# Patient Record
Sex: Male | Born: 2000 | Race: Black or African American | Hispanic: No | Marital: Single | Smoking: Current every day smoker
Health system: Southern US, Community
[De-identification: ages and names within clinical notes are randomized; demographics above are authoritative.]

---
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
## 2018-03-30 IMAGING — DX DG WRIST COMPLETE 3+V*R*
4 series · 4 of 4 positions shown · non-contrast
Comparison: None.

CLINICAL DATA: Fall 1 week ago

EXAM:
RIGHT WRIST - COMPLETE 3+ VIEW

[wrist pa]
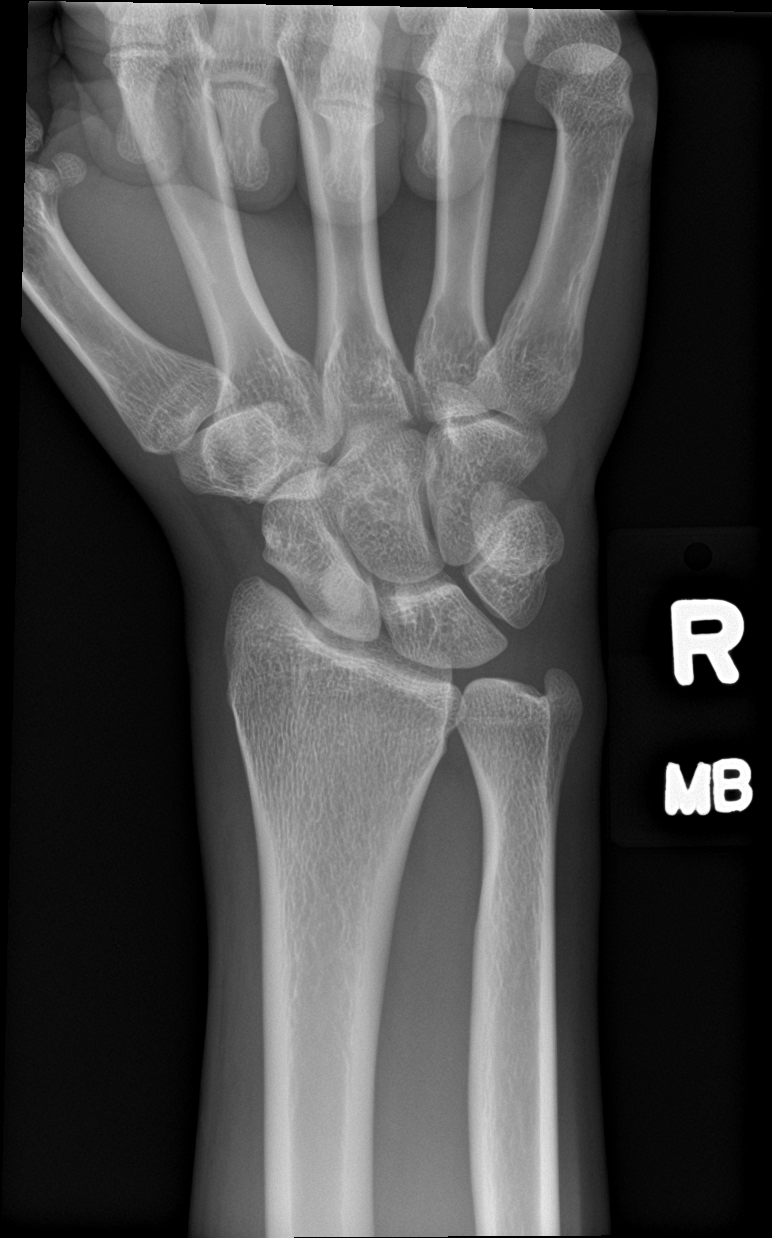

[wrist navicular]
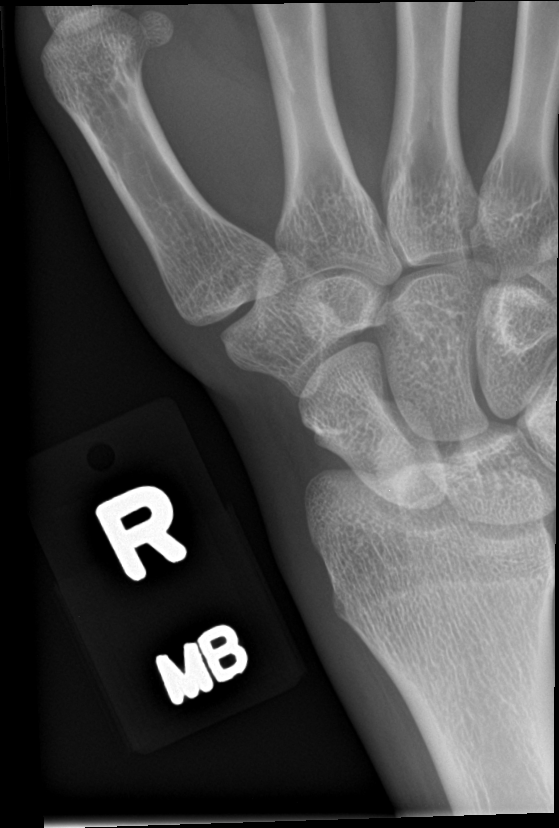

[wrist obl]
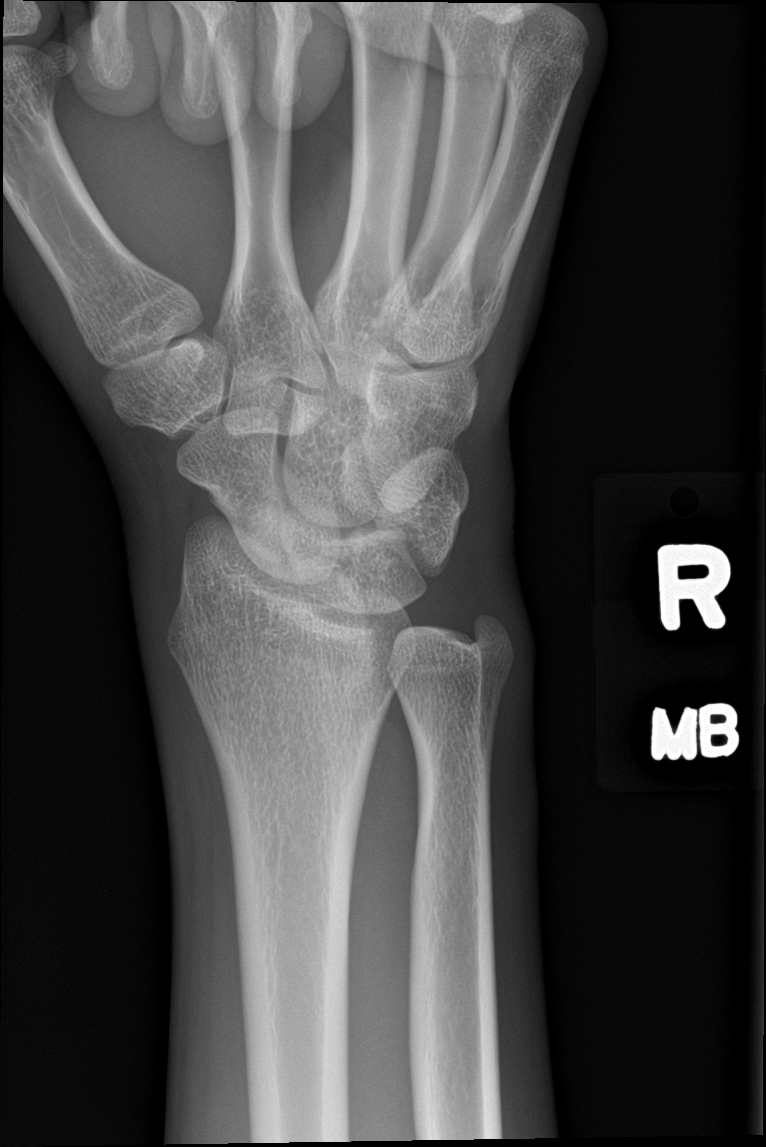

[wrist lat]
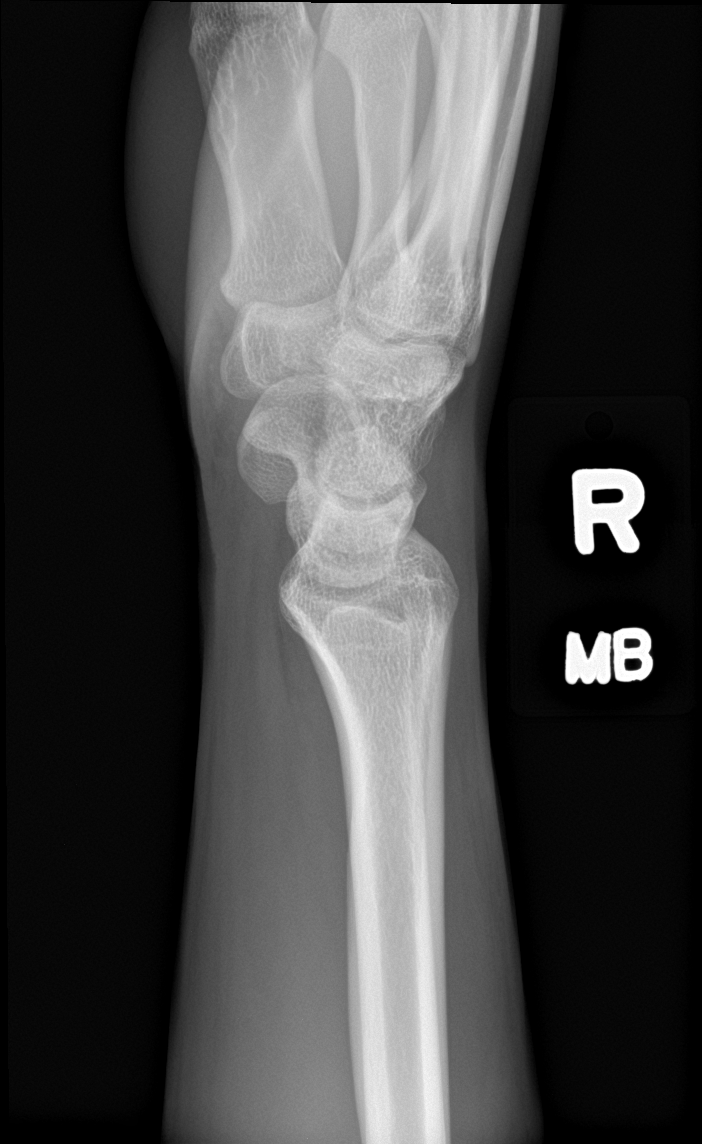

[4 of 4 positions shown; findings below may reference images not displayed]

FINDINGS: There is no evidence of fracture or dislocation. There is no
evidence of arthropathy or other focal bone abnormality. Soft
tissues are unremarkable.
IMPRESSION: Negative.

## 2019-07-07 DIAGNOSIS — F410 Panic disorder [episodic paroxysmal anxiety] without agoraphobia: Secondary | ICD-10-CM | POA: Insufficient documentation

## 2019-07-07 DIAGNOSIS — M549 Dorsalgia, unspecified: Secondary | ICD-10-CM | POA: Insufficient documentation

## 2019-07-07 DIAGNOSIS — R232 Flushing: Secondary | ICD-10-CM | POA: Insufficient documentation

## 2019-08-28 ENCOUNTER — Other Ambulatory Visit: Payer: Self-pay

## 2019-08-28 ENCOUNTER — Encounter (HOSPITAL_COMMUNITY): Payer: Self-pay | Admitting: Emergency Medicine

## 2019-08-28 ENCOUNTER — Ambulatory Visit (HOSPITAL_COMMUNITY)
Admission: EM | Admit: 2019-08-28 | Discharge: 2019-08-28 | Disposition: A | Discharge status: Home / Self Care | Payer: Medicaid Other | Attending: Emergency Medicine | Admitting: Emergency Medicine

## 2019-08-28 DIAGNOSIS — R300 Dysuria: Secondary | ICD-10-CM

## 2019-08-28 DIAGNOSIS — Z113 Encounter for screening for infections with a predominantly sexual mode of transmission: Secondary | ICD-10-CM | POA: Diagnosis present

## 2019-08-28 LAB — POCT URINALYSIS DIP (DEVICE)
Bilirubin Urine: NEGATIVE
Glucose, UA: NEGATIVE mg/dL
Ketones, ur: NEGATIVE mg/dL
Leukocytes,Ua: NEGATIVE
Nitrite: NEGATIVE
Protein, ur: NEGATIVE mg/dL
Specific Gravity, Urine: 1.015 (ref 1.005–1.030)
Urobilinogen, UA: 0.2 mg/dL (ref 0.0–1.0)
pH: 7 (ref 5.0–8.0)

## 2019-08-28 MED ORDER — PHENAZOPYRIDINE HCL 200 MG PO TABS: 200.0000 mg | ORAL_TABLET | Freq: Three times a day (TID) | ORAL | 0 refills | Status: DC

## 2019-08-28 NOTE — Discharge Instructions (Addendum)
Push fluids use Pyridium OTC prn.  Call or return to clinic if these symptoms worsen or fail to improve as anticipated.

## 2019-08-28 NOTE — ED Triage Notes (Signed)
Pt states he thinks he has aUTI x 4 days. Pt states he has been voiding a lot . Pt states he has stomach pain as well.

## 2019-08-28 NOTE — ED Provider Notes (Signed)
[facility]    CSN: [id number] Arrival date & time: 08/28/19  1513      History   Chief Complaint Chief Complaint  Patient presents with  . Urinary Tract Infection    HPI Shawn Ferrell is a 18 y.o. male.   18 years old male presented to the urgent care with a complaint of lower lower abdominal pain, urgency and burning and tingling sensation while urinating x3 to 4 days.  He is sexually active with 1 male partner.  Denies chills and fever, any STD exposure, nausea, vomiting,  diarrhea and penile discharge.  The history is provided by the patient. A language interpreter was used.    History reviewed. No pertinent past medical history.  Patient Active Problem List   Diagnosis Date Noted  . Generalized headaches 12/26/2016    History reviewed. No pertinent surgical history.     Home Medications    Prior to Admission medications   Medication Sig Start Date End Date Taking? Authorizing Provider  cetirizine (ZYRTEC) 10 MG tablet Take 10 mg by mouth daily at 6 PM.    [provider]  phenazopyridine (PYRIDIUM) 200 MG tablet Take 1 tablet (200 mg total) by mouth 3 (three) times daily. 08/28/19   Darrah Dredge, Zachery Dakins, FNP  traZODone (DESYREL) 50 MG tablet Take 50 mg by mouth.    [provider]    Family History No family history on file.  Social History Social History   Tobacco Use  . Smoking status: Never Smoker  . Smokeless tobacco: Never Used  Substance Use Topics  . Alcohol use: No  . Drug use: No     Allergies   Patient has no known allergies.   Review of Systems Review of Systems  Constitutional: Negative.   HENT: Negative.   Respiratory: Negative.   Cardiovascular: Negative.   Gastrointestinal: Positive for abdominal pain.  Genitourinary: Positive for dysuria and urgency.  ROS: All other are negatives   Physical Exam Triage Vital Signs ED Triage Vitals  Enc Vitals Group     BP      Pulse      Resp    Temp      Temp src      SpO2      Weight      Height      Head Circumference      Peak Flow      Pain Score      Pain Loc      Pain Edu?      Excl. in GC?    No data found.  Updated Vital Signs BP 127/68 (BP Location: Right Arm)   Pulse 100   Temp 98 F (36.7 C) (Oral)   Resp 18   SpO2 100%   Visual Acuity Right Eye Distance:   Left Eye Distance:   Bilateral Distance:    Right Eye Near:   Left Eye Near:    Bilateral Near:     Physical Exam Constitutional:      General: He is not in acute distress.    Appearance: Normal appearance. He is normal weight. He is not ill-appearing or toxic-appearing.  Cardiovascular:     Rate and Rhythm: Normal rate and regular rhythm.     Pulses: Normal pulses.     Heart sounds: Normal heart sounds.  Pulmonary:     Effort: Pulmonary effort is normal. No respiratory distress.     Breath sounds: Normal breath sounds.  Chest:  Chest wall: No tenderness.  Abdominal:     General: Bowel sounds are normal. There is no distension.     Palpations: Abdomen is soft. There is no mass.     Tenderness: There is no abdominal tenderness.     Hernia: No hernia is present.  Neurological:     Mental Status: He is alert.      UC Treatments / Results  Labs (all labs ordered are listed, but only abnormal results are displayed) Labs Reviewed  POCT URINALYSIS DIP (DEVICE) - Abnormal; Notable for the following components:      Result Value   Hgb urine dipstick TRACE (*)    All other components within normal limits  URINE CULTURE  CYTOLOGY, (ORAL, ANAL, URETHRAL) ANCILLARY ONLY    EKG   Radiology No results found.  Procedures Procedures (including critical care time)  Medications Ordered in UC Medications - No data to display  Initial Impression / Assessment and Plan / UC Course  I have reviewed the triage vital signs and the nursing notes.  Pertinent labs & imaging results that were available during my care of the patient were  reviewed by me and considered in my medical decision making (see chart for details).     Patient stable for discharge.  Benign physical exam. UA show trace of blood . Urine sent for culture. Gonorrhea screening  and chlamydia was completed. Final Clinical Impressions(s) / UC Diagnoses   Final diagnoses:  Screen for STD (sexually transmitted disease)  Dysuria     Discharge Instructions     Push fluids use Pyridium OTC prn.  Call or return to clinic if these symptoms worsen or fail to improve as anticipated.     ED Prescriptions    Medication Sig Dispense Auth. Provider   phenazopyridine (PYRIDIUM) 200 MG tablet Take 1 tablet (200 mg total) by mouth 3 (three) times daily. 6 tablet Jesse Nosbisch, Darrelyn Hillock, FNP     PDMP not reviewed this encounter.   Emerson Monte, Jessamine 08/28/19 1656

## 2019-08-29 LAB — URINE CULTURE: Culture: NO GROWTH

## 2019-08-30 ENCOUNTER — Telehealth (HOSPITAL_COMMUNITY): Payer: Self-pay | Admitting: Emergency Medicine

## 2019-08-30 LAB — CYTOLOGY, (ORAL, ANAL, URETHRAL) ANCILLARY ONLY
Chlamydia: POSITIVE — AB
Neisseria Gonorrhea: NEGATIVE
Trichomonas: POSITIVE — AB

## 2019-08-30 MED ORDER — METRONIDAZOLE 500 MG PO TABS: 2000.0000 mg | ORAL_TABLET | Freq: Once | ORAL | 0 refills | Status: AC

## 2019-08-30 MED ORDER — AZITHROMYCIN 250 MG PO TABS: 1000.0000 mg | ORAL_TABLET | Freq: Once | ORAL | 0 refills | Status: AC

## 2019-08-30 NOTE — Telephone Encounter (Signed)
Chlamydia is positive.  Rx po zithromax 1g #1 dose no refills was sent to the pharmacy of record.  Pt needs education to please refrain from sexual intercourse for 7 days to give the medicine time to work, sexual partners need to be notified and tested/treated.  Condoms may reduce risk of reinfection.  Recheck or followup with PCP for further evaluation if symptoms are not improving.   [facility] notified.  Trichomonas is positive. Rx  for Flagyl 2 grams, once was sent to the pharmacy of record. Pt needs education to refrain from sexual intercourse for 7 days to give the medicine time to work. Sexual partners need to be notified and tested/treated. Condoms may reduce risk of reinfection. Recheck for further evaluation if symptoms are not improving.   Patient contacted by phone and made aware of    results. Pt verbalized understanding and had all questions answered.

## 2019-10-18 ENCOUNTER — Other Ambulatory Visit: Payer: Self-pay

## 2019-10-18 ENCOUNTER — Encounter (HOSPITAL_COMMUNITY): Payer: Self-pay

## 2019-10-18 ENCOUNTER — Ambulatory Visit (HOSPITAL_COMMUNITY)
Admission: EM | Admit: 2019-10-18 | Discharge: 2019-10-18 | Disposition: A | Discharge status: Home / Self Care | Payer: Medicaid Other | Attending: Internal Medicine | Admitting: Internal Medicine

## 2019-10-18 DIAGNOSIS — R350 Frequency of micturition: Secondary | ICD-10-CM | POA: Diagnosis not present

## 2019-10-18 DIAGNOSIS — R1030 Lower abdominal pain, unspecified: Secondary | ICD-10-CM

## 2019-10-18 LAB — POCT URINALYSIS DIP (DEVICE)
Bilirubin Urine: NEGATIVE
Glucose, UA: NEGATIVE mg/dL
Ketones, ur: NEGATIVE mg/dL
Nitrite: NEGATIVE
Protein, ur: NEGATIVE mg/dL
Specific Gravity, Urine: >=1.03 (ref 1.005–1.030)
Urobilinogen, UA: 0.2 mg/dL (ref 0.0–1.0)
pH: 6.5 (ref 5.0–8.0)

## 2019-10-18 LAB — HIV ANTIBODY (ROUTINE TESTING W REFLEX): HIV Screen 4th Generation wRfx: NONREACTIVE

## 2019-10-18 NOTE — ED Triage Notes (Signed)
Pt c/o lower abd pain x4 days. Denies n/v; last BM this afternoon and states it was soft. States increase in urination with some burning; urgency, frequency x5- 6 days.   Denies fever, chills, hematuria, or penile discharge. Denies URI sx.

## 2019-10-18 NOTE — Discharge Instructions (Addendum)
We have sent your lab work, we will notify you of necessary treatments.  If none of your tests reveal a positive result, please follow up with your primary care to discuss further evaluation  If painful urination or discharge develop prior to results, please notify our clinic and we will arrange treatment.

## 2019-10-18 NOTE — ED Provider Notes (Signed)
[facility]    CSN: [id number] Arrival date & time: 10/18/19  1513      History   Chief Complaint Chief Complaint  Patient presents with  . Abdominal Pain  . Dysuria    HPI Shawn Ferrell is a 19 y.o. male.   Patient reports to urgent care today for 5-6 days of lower abdominal discomfort and increased urinary frequency. He denies painful urination but states a "tingling" after he urinates. The abdominal pain comes and goes. He does not associate it with urination. He denies penile discharge or testicular pain. He denies fever and chills.   He reports completing his prescribed treatments for chlamydia and trichomoniasis in December. He denies sexual contact with that partner during treatment time. Has not been sexually active since Jan 1.      History reviewed. No pertinent past medical history.  Patient Active Problem List   Diagnosis Date Noted  . Generalized headaches 12/26/2016    History reviewed. No pertinent surgical history.     Home Medications    Prior to Admission medications   Medication Sig Start Date End Date Taking? Authorizing Provider  cetirizine (ZYRTEC) 10 MG tablet Take 10 mg by mouth daily at 6 PM.    [provider]  phenazopyridine (PYRIDIUM) 200 MG tablet Take 1 tablet (200 mg total) by mouth 3 (three) times daily. 08/28/19   Avegno, Zachery Dakins, FNP  traZODone (DESYREL) 50 MG tablet Take 50 mg by mouth.    [provider]    Family History Family History  Problem Relation Age of Onset  . Hypertension Mother   . Stroke Father   . Hypertension Father     Social History Social History   Tobacco Use  . Smoking status: Never Smoker  . Smokeless tobacco: Never Used  Substance Use Topics  . Alcohol use: Yes  . Drug use: Yes    Types: Marijuana     Allergies   Patient has no known allergies.   Review of Systems Review of Systems  Constitutional: Negative for chills and fever.  HENT: Negative.     Eyes: Negative for pain and visual disturbance.  Respiratory: Negative.   Cardiovascular: Negative for chest pain and palpitations.  Gastrointestinal: Positive for abdominal pain. Negative for diarrhea, nausea, rectal pain and vomiting.  Genitourinary: Positive for frequency and urgency. Negative for discharge, dysuria, flank pain, hematuria, penile pain and penile swelling.  Musculoskeletal: Negative for arthralgias, back pain and myalgias.  Skin: Negative for color change and rash.  Neurological: Negative for seizures, syncope and headaches.  All other systems reviewed and are negative.    Physical Exam Triage Vital Signs ED Triage Vitals  Enc Vitals Group     BP 10/18/19 1541 120/60     Pulse Rate 10/18/19 1541 79     Resp 10/18/19 1541 16     Temp 10/18/19 1541 98.5 F (36.9 C)     Temp Source 10/18/19 1541 Oral     SpO2 10/18/19 1541 99 %     Weight --      Height --      Head Circumference --      Peak Flow --      Pain Score 10/18/19 1537 5     Pain Loc --      Pain Edu? --      Excl. in GC? --    No data found.  Updated Vital Signs BP 120/60 (BP Location: Right Arm)   Pulse 79  Temp 98.5 F (36.9 C) (Oral)   Resp 16   SpO2 99%   Visual Acuity Right Eye Distance:   Left Eye Distance:   Bilateral Distance:    Right Eye Near:   Left Eye Near:    Bilateral Near:     Physical Exam Vitals and nursing note reviewed.  Constitutional:      General: He is not in acute distress.    Appearance: He is well-developed. He is not ill-appearing.  HENT:     Head: Normocephalic and atraumatic.  Eyes:     Conjunctiva/sclera: Conjunctivae normal.  Cardiovascular:     Rate and Rhythm: Normal rate and regular rhythm.     Heart sounds: No murmur.  Pulmonary:     Effort: Pulmonary effort is normal. No respiratory distress.     Breath sounds: Normal breath sounds.  Abdominal:     General: Abdomen is flat.     Palpations: Abdomen is soft.     Tenderness: There is  no abdominal tenderness. There is no right CVA tenderness or left CVA tenderness.     Hernia: No hernia is present.  Genitourinary:    Penis: Normal.      Testes: Normal.  Musculoskeletal:     Cervical back: Neck supple.  Skin:    General: Skin is warm and dry.  Neurological:     General: No focal deficit present.     Mental Status: He is alert and oriented to person, place, and time.  Psychiatric:        Mood and Affect: Mood normal.        Behavior: Behavior normal.      UC Treatments / Results  Labs (all labs ordered are listed, but only abnormal results are displayed) Labs Reviewed  POCT URINALYSIS DIP (DEVICE) - Abnormal; Notable for the following components:      Result Value   Hgb urine dipstick TRACE (*)    Leukocytes,Ua TRACE (*)    All other components within normal limits  URINE CULTURE  HIV ANTIBODY (ROUTINE TESTING W REFLEX)  RPR  CYTOLOGY, (ORAL, ANAL, URETHRAL) ANCILLARY ONLY    EKG   Radiology No results found.  Procedures Procedures (including critical care time)  Medications Ordered in UC Medications - No data to display  Initial Impression / Assessment and Plan / UC Course  I have reviewed the triage vital signs and the nursing notes.  Pertinent labs & imaging results that were available during my care of the patient were reviewed by me and considered in my medical decision making (see chart for details).     #Urinary Frequency #Abdominal Discomfort - no clear etiology. History of Chlamydia and trichomonas, treated in December. No dysuria. UA with trace leuks and Hgb, considering males less likely to have cystitis,  Will wait for Swab and urine culture to treat.    Final Clinical Impressions(s) / UC Diagnoses   Final diagnoses:  Lower abdominal pain  Urinary frequency     Discharge Instructions     We have sent your lab work, we will notify you of necessary treatments.  If none of your tests reveal a positive result, please  follow up with your primary care to discuss further evaluation  If painful urination or discharge develop prior to results, please notify our clinic and we will arrange treatment.      ED Prescriptions    None     PDMP not reviewed this encounter.   Purnell Shoemaker, PA-C 10/18/19  1913  

## 2019-10-19 LAB — RPR: RPR Ser Ql: NONREACTIVE

## 2019-10-21 LAB — CYTOLOGY, (ORAL, ANAL, URETHRAL) ANCILLARY ONLY
Chlamydia: NEGATIVE
Neisseria Gonorrhea: NEGATIVE
Trichomonas: NEGATIVE

## 2019-10-27 ENCOUNTER — Telehealth (HOSPITAL_COMMUNITY): Payer: Self-pay

## 2019-12-11 ENCOUNTER — Other Ambulatory Visit: Payer: Self-pay

## 2019-12-11 ENCOUNTER — Encounter (HOSPITAL_COMMUNITY): Payer: Self-pay

## 2019-12-11 ENCOUNTER — Ambulatory Visit (HOSPITAL_COMMUNITY)
Admission: EM | Admit: 2019-12-11 | Discharge: 2019-12-11 | Disposition: A | Discharge status: Home / Self Care | Payer: Medicaid Other | Attending: Family Medicine | Admitting: Family Medicine

## 2019-12-11 DIAGNOSIS — N341 Nonspecific urethritis: Secondary | ICD-10-CM | POA: Diagnosis not present

## 2019-12-11 MED ORDER — FLUCONAZOLE 150 MG PO TABS: 150.0000 mg | ORAL_TABLET | Freq: Once | ORAL | 0 refills | Status: AC

## 2019-12-11 MED ORDER — DOXYCYCLINE HYCLATE 100 MG PO TABS: 100.0000 mg | ORAL_TABLET | Freq: Two times a day (BID) | ORAL | 0 refills | Status: DC

## 2019-12-11 NOTE — ED Provider Notes (Addendum)
[facility]    CSN: [id number] Arrival date & time: 12/11/19  1111      History   Chief Complaint Chief Complaint  Patient presents with  . Abdominal Pain    HPI Shawn Ferrell is a 19 y.o. male.   19 yo established [facility] patient with urinary symptoms.  He was seen here at the end of January for similar sx.  Pt is here with frequent urination, itching when urinating and that his penis is sore, states this started back up 1 week ago.   He's been seen twice for this intermittent irritation.  No discharge, fever.    Patient does odd jobs in Aeronautical engineer.  Has same sexual partner during the last several months.     History reviewed. No pertinent past medical history.  Patient Active Problem List   Diagnosis Date Noted  . Generalized headaches 12/26/2016    History reviewed. No pertinent surgical history.     Home Medications    Prior to Admission medications   Medication Sig Start Date End Date Taking? Authorizing Provider  fluticasone (FLONASE) 50 MCG/ACT nasal spray instill 1 spray into each nostril once daily 07/27/16  Yes [provider]  hydrocortisone 2.5 % cream apply to affected area three times a day 07/27/16  Yes [provider]  cetirizine (ZYRTEC) 10 MG tablet take 1 tablet by mouth once daily 07/27/16 12/11/19 Yes [provider]  ranitidine (ZANTAC) 150 MG tablet  07/25/16 12/11/19 Yes [provider]  albuterol (VENTOLIN HFA) 108 (90 Base) MCG/ACT inhaler  06/28/19   [provider]  ARIPiprazole (ABILIFY) 15 MG tablet Take by mouth.    [provider]  doxycycline (VIBRA-TABS) 100 MG tablet Take 1 tablet (100 mg total) by mouth 2 (two) times daily. 12/11/19   Elvina Sidle, MD  fluconazole (DIFLUCAN) 150 MG tablet Take 1 tablet (150 mg total) by mouth once for 1 dose. Repeat if needed 12/11/19 12/11/19  Elvina Sidle, MD  traZODone (DESYREL) 50 MG tablet Take 50 mg by mouth.    [provider]  traZODone (DESYREL) 50 MG tablet Take by mouth.    [provider]    Family History Family History  Problem Relation Age of Onset  . Hypertension Mother   . Stroke Father   . Hypertension Father     Social History Social History   Tobacco Use  . Smoking status: Never Smoker  . Smokeless tobacco: Never Used  Substance Use Topics  . Alcohol use: Yes  . Drug use: Yes    Types: Marijuana     Allergies   Patient has no known allergies.   Review of Systems Review of Systems   Physical Exam Triage Vital Signs ED Triage Vitals  Enc Vitals Group     BP 12/11/19 1140 119/73     Pulse Rate 12/11/19 1140 86     Resp 12/11/19 1140 17     Temp 12/11/19 1140 98.7 F (37.1 C)     Temp Source 12/11/19 1140 Oral     SpO2 12/11/19 1140 98 %     Weight 12/11/19 1138 116 lb 3.2 oz (52.7 kg)     Height --      Head Circumference --      Peak Flow --      Pain Score 12/11/19 1137 0     Pain Loc --      Pain Edu? --      Excl. in GC? --  No data found.  Updated Vital Signs BP 119/73 (BP Location: Right Arm)   Pulse 86   Temp 98.7 F (37.1 C) (Oral)   Resp 17   Wt 52.7 kg   SpO2 98%    Physical Exam Vitals and nursing note reviewed.  Constitutional:      Appearance: He is well-developed and normal weight.  HENT:     Head: Normocephalic.  Eyes:     Extraocular Movements: Extraocular movements intact.  Pulmonary:     Effort: Pulmonary effort is normal.  Abdominal:     Hernia: No hernia is present.  Genitourinary:    Testes:        Right: Tenderness not present.        Left: Tenderness not present.     Comments: Small meatus of penis Small left testicle Skin:    General: Skin is warm and dry.  Neurological:     General: No focal deficit present.     Mental Status: He is alert.  Psychiatric:        Mood and Affect: Mood normal.        Behavior: Behavior normal.      UC Treatments / Results  Labs (all labs ordered are  listed, but only abnormal results are displayed) Labs Reviewed - No data to display  EKG   Radiology No results found.  Procedures Procedures (including critical care time)  Medications Ordered in UC Medications - No data to display  Initial Impression / Assessment and Plan / UC Course  I have reviewed the triage vital signs and the nursing notes.  Pertinent labs & imaging results that were available during my care of the patient were reviewed by me and considered in my medical decision making (see chart for details).    Final Clinical Impressions(s) / UC Diagnoses   Final diagnoses:  Nonspecific urethritis     Discharge Instructions     If you are not better by the end of the weekend, call the urologist above for consultation    ED Prescriptions    Medication Sig Dispense Auth. Provider   fluconazole (DIFLUCAN) 150 MG tablet Take 1 tablet (150 mg total) by mouth once for 1 dose. Repeat if needed 2 tablet Robyn Haber, MD   doxycycline (VIBRA-TABS) 100 MG tablet Take 1 tablet (100 mg total) by mouth 2 (two) times daily. 20 tablet Robyn Haber, MD     I have reviewed the PDMP during this encounter.   Robyn Haber, MD 12/11/19 1215    Robyn Haber, MD 12/11/19 [phone]

## 2019-12-11 NOTE — ED Triage Notes (Signed)
Pt is here with frequent urination, itching when urinating and that his penis is sore, states this started back up 1 week ago. His last visit on 10/18/2019 he had the same symptoms.

## 2019-12-11 NOTE — Discharge Instructions (Addendum)
If you are not better by the end of the weekend, call the urologist above for consultation

## 2020-01-15 ENCOUNTER — Other Ambulatory Visit: Payer: Self-pay

## 2020-01-15 ENCOUNTER — Ambulatory Visit (HOSPITAL_COMMUNITY)
Admission: EM | Admit: 2020-01-15 | Discharge: 2020-01-15 | Disposition: A | Discharge status: Home / Self Care | Payer: Medicaid Other | Attending: Emergency Medicine | Admitting: Emergency Medicine

## 2020-01-15 DIAGNOSIS — J029 Acute pharyngitis, unspecified: Secondary | ICD-10-CM | POA: Diagnosis not present

## 2020-01-15 LAB — POCT RAPID STREP A: Streptococcus, Group A Screen (Direct): NEGATIVE

## 2020-01-15 MED ORDER — LIDOCAINE VISCOUS HCL 2 % MT SOLN: 15.0000 mL | OROMUCOSAL | 1 refills | Status: DC | PRN

## 2020-01-15 MED ORDER — MENTHOL 3 MG MT LOZG: 1.0000 | LOZENGE | OROMUCOSAL | 0 refills | Status: DC | PRN

## 2020-01-15 MED ORDER — CETIRIZINE HCL 10 MG PO TABS: 10.0000 mg | ORAL_TABLET | Freq: Every day | ORAL | 0 refills | Status: AC

## 2020-01-15 NOTE — ED Provider Notes (Signed)
[place]   [id number] 01/15/20 Arrival Time: 1112  SUBJECTIVE:  Shawn Ferrell is a 19 y.o. male presented to the urgent care with a complaint of sore throat for the past week.  Patient denies sick exposure  or precipitating event.  Patient  describes pain as constant and rated at 10 on a scale 1-10.  Has tried OTC medication without relief.  Symptoms made worse with swallowing, but has normal PO intake.  Reports/ denies similar symptoms in the past.  Denies fever, chills, congestion, rhinorrhea, sneezing, cough, SOB, chest pain, abdominal pain, rash, changes in bowel or bladder function.    Review of Systems  Constitutional: Negative.   HENT: Positive for sore throat.   Respiratory: Negative.   Cardiovascular: Negative.   All other systems reviewed and are negative.   No past medical history on file. No past surgical history on file. No Known Allergies    Social History   Socioeconomic History  . Marital status: Single    Spouse name: Not on file  . Number of children: Not on file  . Years of education: Not on file  . Highest education level: Not on file  Occupational History  . Not on file  Tobacco Use  . Smoking status: Never Smoker  . Smokeless tobacco: Never Used  Substance and Sexual Activity  . Alcohol use: Yes  . Drug use: Yes    Types: Marijuana  . Sexual activity: Yes    Birth control/protection: None  Other Topics Concern  . Not on file  Social History Narrative   Lives with mom only. Not employed currently. Grades above average   Social Determinants of Health   Financial Resource Strain:   . Difficulty of Paying Living Expenses:   Food Insecurity:   . Worried About Charity fundraiser in the Last Year:   . Arboriculturist in the Last Year:   Transportation Needs:   . Film/video editor (Medical):   Marland Kitchen Lack of Transportation (Non-Medical):   Physical Activity:   . Days of Exercise per Week:   . Minutes of Exercise per Session:     Stress:   . Feeling of Stress :   Social Connections:   . Frequency of Communication with Friends and Family:   . Frequency of Social Gatherings with Friends and Family:   . Attends Religious Services:   . Active Member of Clubs or Organizations:   . Attends Archivist Meetings:   Marland Kitchen Marital Status:   Intimate Partner Violence:   . Fear of Current or Ex-Partner:   . Emotionally Abused:   Marland Kitchen Physically Abused:   . Sexually Abused:    Family History  Problem Relation Age of Onset  . Hypertension Mother   . Stroke Father   . Hypertension Father     OBJECTIVE:  Vitals:   01/15/20 1144  BP: 122/81  Pulse: 79  Resp: 16  Temp: 98.5 F (36.9 C)  TempSrc: Oral  SpO2: 100%     General appearance: alert; no distress HEENT: EAC clear, TM pearly gray with visible landmarks; PERRL, EOM grossly intact; Nose without deformity, without rhinorrhea; throat with 2+ erythematous tonsils with white exudates ; uvula midline Neck: supple with FROM; no lymphadenopathy Lungs: clear to auscultation bilaterally without adventitious breath sounds Heart: regular rate and rhythm.  Radial pulses 2+ symmetrical bilaterally Skin: reveals no rash; warm and dry Psychological: alert and cooperative; normal mood and affect  ASSESSMENT & PLAN:  1.  Sore throat     Meds ordered this encounter  Medications  . menthol-cetylpyridinium (CEPACOL) 3 MG lozenge    Sig: Take 1 lozenge (3 mg total) by mouth as needed for sore throat.    Dispense:  100 tablet    Refill:  0  . cetirizine (ZYRTEC ALLERGY) 10 MG tablet    Sig: Take 1 tablet (10 mg total) by mouth daily.    Dispense:  30 tablet    Refill:  0  . lidocaine (XYLOCAINE) 2 % solution    Sig: Use as directed 15 mLs in the mouth or throat as needed for mouth pain.    Dispense:  100 mL    Refill:  1  COVID-19 test result from this patient for rule out.  Patient declined to have COVID-19 test completed.  Results for orders placed or  performed during the hospital encounter of 01/15/20  POCT rapid strep A [facility])  Result Value Ref Range   Streptococcus, Group A Screen (Direct) NEGATIVE NEGATIVE   Labs Reviewed  CULTURE, GROUP A STREP [facility])  POCT RAPID STREP A   Discharge instruction   Strep was negative.  We will call you if abnormal result Drink plenty of water and rest Viscous Lidocaine, Cepacol and zyrtec were prescribed Perform salt water gargles at home, throat lozenges Follow up with PCP if symptoms persist Return or go to ER if you have any new or worsening symptomReviewed expectations re: course of current medical issues. Questions answered. Outlined signs and symptoms indicating need for more acute intervention. Patient verbalized understanding. After Visit Summary given.     Durward Parcel, FNP 01/15/20 1257

## 2020-01-15 NOTE — ED Triage Notes (Signed)
Pt is here with a sore throat that started a week ago, pt has not taken any meds to relieve discomfort.

## 2020-01-15 NOTE — Discharge Instructions (Addendum)
Strep was negative.  We will call you if abnormal result Drink plenty of water and rest Viscous Lidocaine, Cepacol and zyrtec were prescribed Perform salt water gargles at home, throat lozenges Follow up with PCP if symptoms persist Return or go to ER if you have any new or worsening symptom

## 2020-01-17 LAB — CULTURE, GROUP A STREP (THRC)

## 2021-06-10 ENCOUNTER — Ambulatory Visit
Admission: EM | Admit: 2021-06-10 | Discharge: 2021-06-10 | Disposition: A | Discharge status: Home / Self Care | Payer: Medicaid Other | Attending: Urgent Care | Admitting: Urgent Care

## 2021-06-10 ENCOUNTER — Other Ambulatory Visit: Payer: Self-pay

## 2021-06-10 DIAGNOSIS — Z113 Encounter for screening for infections with a predominantly sexual mode of transmission: Secondary | ICD-10-CM | POA: Diagnosis present

## 2021-06-10 DIAGNOSIS — Z7251 High risk heterosexual behavior: Secondary | ICD-10-CM | POA: Diagnosis not present

## 2021-06-10 NOTE — ED Provider Notes (Signed)
[facility]   MRN: [id number] DOB: 03-24-2001  Subjective:   Shawn Ferrell is a 20 y.o. male presenting for STI check.  Patient has unprotected sex with 1 male. Denies dysuria, hematuria, urinary frequency, penile discharge, penile swelling, testicular pain, testicular swelling, anal pain, groin pain, abdominal pain, pelvic pain, throat pain.  No concern for an oral or anal STI infection.  No current facility-administered medications for this encounter.  Current Outpatient Medications:    albuterol (VENTOLIN HFA) 108 (90 Base) MCG/ACT inhaler, , Disp: , Rfl:    ARIPiprazole (ABILIFY) 15 MG tablet, Take by mouth., Disp: , Rfl:    cetirizine (ZYRTEC ALLERGY) 10 MG tablet, Take 1 tablet (10 mg total) by mouth daily., Disp: 30 tablet, Rfl: 0   doxycycline (VIBRA-TABS) 100 MG tablet, Take 1 tablet (100 mg total) by mouth 2 (two) times daily., Disp: 20 tablet, Rfl: 0   fluconazole (DIFLUCAN) 150 MG tablet, Take 150 mg by mouth once., Disp: , Rfl:    fluticasone (FLONASE) 50 MCG/ACT nasal spray, instill 1 spray into each nostril once daily, Disp: , Rfl:    hydrocortisone 2.5 % cream, apply to affected area three times a day, Disp: , Rfl:    lidocaine (XYLOCAINE) 2 % solution, Use as directed 15 mLs in the mouth or throat as needed for mouth pain., Disp: 100 mL, Rfl: 1   menthol-cetylpyridinium (CEPACOL) 3 MG lozenge, Take 1 lozenge (3 mg total) by mouth as needed for sore throat., Disp: 100 tablet, Rfl: 0   traZODone (DESYREL) 50 MG tablet, Take 50 mg by mouth., Disp: , Rfl:    traZODone (DESYREL) 50 MG tablet, Take by mouth., Disp: , Rfl:    No Known Allergies  No past medical history on file.   No past surgical history on file.  Family History  Problem Relation Age of Onset   Hypertension Mother    Stroke Father    Hypertension Father     Social History   Tobacco Use   Smoking status: Never   Smokeless tobacco: Never  Vaping Use   Vaping Use: Former   Substance Use Topics   Alcohol use: Yes   Drug use: Yes    Types: Marijuana    ROS   Objective:   Vitals: BP 134/75 (BP Location: Left Arm)   Pulse 88   Temp 98.4 F (36.9 C) (Oral)   Resp 18   SpO2 97%   Physical Exam Constitutional:      General: He is not in acute distress.    Appearance: Normal appearance. He is well-developed and normal weight. He is not ill-appearing, toxic-appearing or diaphoretic.  HENT:     Head: Normocephalic and atraumatic.     Right Ear: External ear normal.     Left Ear: External ear normal.     Nose: Nose normal.     Mouth/Throat:     Pharynx: Oropharynx is clear.  Eyes:     General: No scleral icterus.       Right eye: No discharge.        Left eye: No discharge.     Extraocular Movements: Extraocular movements intact.     Pupils: Pupils are equal, round, and reactive to light.  Cardiovascular:     Rate and Rhythm: Normal rate.  Pulmonary:     Effort: Pulmonary effort is normal.  Musculoskeletal:     Cervical back: Normal range of motion.  Neurological:     Mental Status: He is alert and  oriented to person, place, and time.  Psychiatric:        Mood and Affect: Mood normal.        Behavior: Behavior normal.        Thought Content: Thought content normal.        Judgment: Judgment normal.    Assessment and Plan :   PDMP not reviewed this encounter.  1. Unprotected sex   2. Screen for STD (sexually transmitted disease)     Recommended HIV and syphilis testing but patient declined for now.  We will treat as appropriate based off of his cytology results.   Wallis Bamberg, PA-C 06/10/21 1404

## 2021-06-10 NOTE — ED Triage Notes (Signed)
Pt c/o RLQ pain onset 2-3 days ago that has now corrected. States he would like an STI screening. C/o associated polyuria. Denies pain, burning, itching, scratchy, discharge, lesions, hematuria, pelvic pain, lower back pain.

## 2021-06-11 LAB — CYTOLOGY, (ORAL, ANAL, URETHRAL) ANCILLARY ONLY
Chlamydia: NEGATIVE
Comment: NEGATIVE
Comment: NEGATIVE
Comment: NORMAL
Neisseria Gonorrhea: NEGATIVE
Trichomonas: POSITIVE — AB

## 2021-06-14 ENCOUNTER — Telehealth (HOSPITAL_COMMUNITY): Payer: Self-pay | Admitting: Emergency Medicine

## 2021-06-14 MED ORDER — METRONIDAZOLE 500 MG PO TABS: 2000.0000 mg | ORAL_TABLET | Freq: Once | ORAL | 0 refills | Status: AC

## 2021-12-18 ENCOUNTER — Other Ambulatory Visit: Payer: Self-pay

## 2021-12-18 ENCOUNTER — Ambulatory Visit
Admission: EM | Admit: 2021-12-18 | Discharge: 2021-12-18 | Disposition: A | Discharge status: Home / Self Care | Payer: Medicaid Other | Attending: Physician Assistant | Admitting: Physician Assistant

## 2021-12-18 DIAGNOSIS — Z113 Encounter for screening for infections with a predominantly sexual mode of transmission: Secondary | ICD-10-CM | POA: Diagnosis present

## 2021-12-18 DIAGNOSIS — Z8709 Personal history of other diseases of the respiratory system: Secondary | ICD-10-CM | POA: Diagnosis present

## 2021-12-18 MED ORDER — ALBUTEROL SULFATE HFA 108 (90 BASE) MCG/ACT IN AERS: 1.0000 | INHALATION_SPRAY | Freq: Four times a day (QID) | RESPIRATORY_TRACT | 0 refills | Status: AC | PRN

## 2021-12-18 NOTE — ED Provider Notes (Signed)
?[name] ? ? ? ?CSN: [id number] ?Arrival date & time: 12/18/21  1027 ? ? ?  ? ?History   ?Chief Complaint ?Chief Complaint  ?Patient presents with  ? S74.5  ? ? ?HPI ?Shawn Ferrell is a 21 y.o. male.  ? ?Patient here today for STD screening. He denies any symptoms. He does have some "bumps" that he believes are just hair bumps on his penis. He reports they are not painful or itchy. Patient reports he does shave this area. He has not had any known exposures. He declines blood work.  ? ?He would also like refill of inhaler if possible. He uses this only prn if he is short of breath with exercise, etc. He is a smoker.  ? ?The history is provided by the patient.  ? ?History reviewed. No pertinent past medical history. ? ?Patient Active Problem List  ? Diagnosis Date Noted  ? Generalized headaches 12/26/2016  ? ? ?History reviewed. No pertinent surgical history. ? ? ? ? ?Home Medications   ? ?Prior to Admission medications   ?Medication Sig Start Date End Date Taking? Authorizing Provider  ?ranitidine (ZANTAC) 150 MG tablet  07/25/16 12/11/19  [provider]  ?albuterol (VENTOLIN HFA) 108 (90 Base) MCG/ACT inhaler Inhale 1 puff into the lungs every 6 (six) hours as needed for wheezing or shortness of breath. 12/18/21   Tomi Bamberger, PA-C  ?ARIPiprazole (ABILIFY) 15 MG tablet Take by mouth.    [provider]  ?cetirizine (ZYRTEC ALLERGY) 10 MG tablet Take 1 tablet (10 mg total) by mouth daily. 01/15/20   Avegno, Zachery Dakins, FNP  ?doxycycline (VIBRA-TABS) 100 MG tablet Take 1 tablet (100 mg total) by mouth 2 (two) times daily. 12/11/19   Elvina Sidle, MD  ?fluconazole (DIFLUCAN) 150 MG tablet Take 150 mg by mouth once. 12/11/19   [provider]  ?fluticasone Aleda Grana) 50 MCG/ACT nasal spray instill 1 spray into each nostril once daily 07/27/16   [provider]  ?hydrocortisone 2.5 % cream apply to affected area three times a day 07/27/16   [provider]   ?lidocaine (XYLOCAINE) 2 % solution Use as directed 15 mLs in the mouth or throat as needed for mouth pain. 01/15/20   Durward Parcel, FNP  ?menthol-cetylpyridinium (CEPACOL) 3 MG lozenge Take 1 lozenge (3 mg total) by mouth as needed for sore throat. 01/15/20   Avegno, Zachery Dakins, FNP  ?traZODone (DESYREL) 50 MG tablet Take 50 mg by mouth.    [provider]  ?traZODone (DESYREL) 50 MG tablet Take by mouth.    [provider]  ? ? ?Family History ?Family History  ?Problem Relation Age of Onset  ? Hypertension Mother   ? Stroke Father   ? Hypertension Father   ? ? ?Social History ?Social History  ? ?Tobacco Use  ? Smoking status: Never  ? Smokeless tobacco: Never  ?Vaping Use  ? Vaping Use: Former  ?Substance Use Topics  ? Alcohol use: Yes  ? Drug use: Yes  ?  Types: Marijuana  ? ? ? ?Allergies   ?Patient has no known allergies. ? ? ?Review of Systems ?Review of Systems  ?Constitutional:  Negative for chills and fever.  ?Eyes:  Negative for discharge and redness.  ?Respiratory:  Negative for shortness of breath.   ?Gastrointestinal:  Negative for abdominal pain, nausea and vomiting.  ?Genitourinary:  Negative for genital sores and penile discharge.  ?Neurological:  Negative for numbness.  ? ? ?Physical Exam ?Triage  Vital Signs ?ED Triage Vitals [12/18/21 1115]  ?Enc Vitals Group  ?   BP 122/77  ?   Pulse Rate 83  ?   Resp 17  ?   Temp 98.7 ?F (37.1 ?C)  ?   Temp Source Oral  ?   SpO2 94 %  ?   Weight   ?   Height   ?   Head Circumference   ?   Peak Flow   ?   Pain Score 0  ?   Pain Loc   ?   Pain Edu?   ?   Excl. in GC?   ? ?No data found. ? ?Updated Vital Signs ?BP 122/77 (BP Location: Left Arm)   Pulse 83   Temp 98.7 ?F (37.1 ?C) (Oral)   Resp 17   SpO2 94%  ?   ? ?Physical Exam ?Vitals and nursing note reviewed.  ?Constitutional:   ?   General: He is not in acute distress. ?   Appearance: Normal appearance. He is not ill-appearing.  ?HENT:  ?   Head: Normocephalic and atraumatic.  ?Eyes:   ?   Conjunctiva/sclera: Conjunctivae normal.  ?Cardiovascular:  ?   Rate and Rhythm: Normal rate.  ?Pulmonary:  ?   Effort: Pulmonary effort is normal.  ?Genitourinary: ?   Comments: Few scattered flesh toned lesions without erythema, ulceration, etc noted to base of penis; Chaperone present: Haywood Pao, RN ?Neurological:  ?   Mental Status: He is alert.  ?Psychiatric:     ?   Mood and Affect: Mood normal.     ?   Behavior: Behavior normal.     ?   Thought Content: Thought content normal.  ? ? ? ?UC Treatments / Results  ?Labs ?(all labs ordered are listed, but only abnormal results are displayed) ?Labs Reviewed  ?CYTOLOGY, (ORAL, ANAL, URETHRAL) ANCILLARY ONLY  ? ? ?EKG ? ? ?Radiology ?No results found. ? ?Procedures ?Procedures (including critical care time) ? ?Medications Ordered in UC ?Medications - No data to display ? ?Initial Impression / Assessment and Plan / UC Course  ?I have reviewed the triage vital signs and the nursing notes. ? ?Pertinent labs & imaging results that were available during my care of the patient were reviewed by me and considered in my medical decision making (see chart for details). ? ?  ?Will order STD screening as requested. Genital lesions appear benign. Albuterol inhaler refilled. Encouraged follow up with any further concerns.  ? ?Final Clinical Impressions(s) / UC Diagnoses  ? ?Final diagnoses:  ?Screening examination for STD (sexually transmitted disease)  ?History of reactive airway disease  ? ?Discharge Instructions   ?None ?  ? ?ED Prescriptions   ? ? Medication Sig Dispense Auth. Provider  ? albuterol (VENTOLIN HFA) 108 (90 Base) MCG/ACT inhaler Inhale 1 puff into the lungs every 6 (six) hours as needed for wheezing or shortness of breath. 8 g Tomi Bamberger, PA-C  ? ?  ? ?PDMP not reviewed this encounter. ?  ?Tomi Bamberger, PA-C ?12/18/21 1221 ? ?

## 2021-12-18 NOTE — ED Triage Notes (Signed)
Pt is requesting STD testing. Denies sxs. Also asking for albuterol refill. ?

## 2021-12-20 LAB — CYTOLOGY, (ORAL, ANAL, URETHRAL) ANCILLARY ONLY
Chlamydia: NEGATIVE
Comment: NEGATIVE
Comment: NEGATIVE
Comment: NORMAL
Neisseria Gonorrhea: POSITIVE — AB
Trichomonas: NEGATIVE

## 2021-12-21 ENCOUNTER — Encounter: Payer: Self-pay | Admitting: Emergency Medicine

## 2021-12-21 ENCOUNTER — Telehealth (HOSPITAL_COMMUNITY): Payer: Self-pay | Admitting: Emergency Medicine

## 2021-12-21 ENCOUNTER — Ambulatory Visit
Admission: EM | Admit: 2021-12-21 | Discharge: 2021-12-21 | Disposition: A | Discharge status: Home / Self Care | Payer: Medicaid Other | Attending: Internal Medicine | Admitting: Internal Medicine

## 2021-12-21 ENCOUNTER — Other Ambulatory Visit: Payer: Self-pay

## 2021-12-21 DIAGNOSIS — A549 Gonococcal infection, unspecified: Secondary | ICD-10-CM | POA: Diagnosis not present

## 2021-12-21 MED ORDER — CEFTRIAXONE SODIUM 1 G IJ SOLR
0.5000 g | Freq: Once | INTRAMUSCULAR | Status: AC
  Administered 2021-12-21: 0.5 g via INTRAMUSCULAR

## 2021-12-21 NOTE — ED Triage Notes (Signed)
Pt here for STD treatment  

## 2021-12-21 NOTE — Telephone Encounter (Signed)
Per protocol, patient will need treatment with IM Rocephin  500 mg for positive Gonorrhea Contacted patient by phone.  Verified identity using two identifiers.  Provided positive result.  Reviewed safe sex practices, notifying partners, and refraining from sexual activities for 7 days from time of treatment.  Patient verified understanding, all questions answered.   [facility] notified 

## 2022-02-12 ENCOUNTER — Ambulatory Visit
Admission: RE | Admit: 2022-02-12 | Discharge: 2022-02-12 | Disposition: A | Discharge status: Home / Self Care | Payer: Medicaid Other | Source: Ambulatory Visit | Attending: Family Medicine | Admitting: Family Medicine

## 2022-02-12 ENCOUNTER — Other Ambulatory Visit: Payer: Self-pay

## 2022-02-12 VITALS — BP 126/82 | HR 94 | Temp 97.4°F | Resp 18

## 2022-02-12 DIAGNOSIS — Z113 Encounter for screening for infections with a predominantly sexual mode of transmission: Secondary | ICD-10-CM | POA: Insufficient documentation

## 2022-02-12 NOTE — ED Provider Notes (Signed)
[name]    CSN: [id number] Arrival date & time: 02/12/22  0948      History   Chief Complaint Chief Complaint  Patient presents with   Appointment    1000   Exposure to STD    HPI Shawn Ferrell is a 21 y.o. male.    Exposure to STD  Here for occasional dysuria.  No penile discharge or itching.  Because of the dysuria he wishes to get screened for STDs.  He declines blood testing had no fever or abdominal pain  History reviewed. No pertinent past medical history.  Patient Active Problem List   Diagnosis Date Noted   Generalized headaches 12/26/2016    History reviewed. No pertinent surgical history.     Home Medications    Prior to Admission medications   Medication Sig Start Date End Date Taking? Authorizing Provider  ranitidine (ZANTAC) 150 MG tablet  07/25/16 12/11/19  [provider]  albuterol (VENTOLIN HFA) 108 (90 Base) MCG/ACT inhaler Inhale 1 puff into the lungs every 6 (six) hours as needed for wheezing or shortness of breath. 12/18/21   Tomi Bamberger, PA-C  ARIPiprazole (ABILIFY) 15 MG tablet Take by mouth.    [provider]  cetirizine (ZYRTEC ALLERGY) 10 MG tablet Take 1 tablet (10 mg total) by mouth daily. 01/15/20   Avegno, Zachery Dakins, FNP  fluticasone (FLONASE) 50 MCG/ACT nasal spray instill 1 spray into each nostril once daily 07/27/16   [provider]  hydrocortisone 2.5 % cream apply to affected area three times a day 07/27/16   [provider]  traZODone (DESYREL) 50 MG tablet Take 50 mg by mouth.    [provider]  traZODone (DESYREL) 50 MG tablet Take by mouth.    [provider]    Family History Family History  Problem Relation Age of Onset   Hypertension Mother    Stroke Father    Hypertension Father     Social History Social History   Tobacco Use   Smoking status: Never   Smokeless tobacco: Never  Vaping Use   Vaping Use: Former  Substance Use Topics    Alcohol use: Yes   Drug use: Yes    Types: Marijuana     Allergies   Patient has no known allergies.   Review of Systems Review of Systems   Physical Exam Triage Vital Signs ED Triage Vitals  Enc Vitals Group     BP 02/12/22 1017 126/82     Pulse Rate 02/12/22 1017 94     Resp 02/12/22 1017 18     Temp 02/12/22 1017 (!) 97.4 F (36.3 C)     Temp Source 02/12/22 1017 Oral     SpO2 02/12/22 1017 99 %     Weight --      Height --      Head Circumference --      Peak Flow --      Pain Score 02/12/22 1018 0     Pain Loc --      Pain Edu? --      Excl. in GC? --    No data found.  Updated Vital Signs BP 126/82 (BP Location: Left Arm)   Pulse 94   Temp (!) 97.4 F (36.3 C) (Oral)   Resp 18   SpO2 99%   Visual Acuity Right Eye Distance:   Left Eye Distance:   Bilateral Distance:    Right Eye Near:   Left Eye Near:  Bilateral Near:     Physical Exam Vitals reviewed.  Constitutional:      General: He is not in acute distress.    Appearance: He is not toxic-appearing.  Cardiovascular:     Rate and Rhythm: Normal rate and regular rhythm.  Neurological:     Mental Status: He is alert and oriented to person, place, and time.  Psychiatric:        Behavior: Behavior normal.     UC Treatments / Results  Labs (all labs ordered are listed, but only abnormal results are displayed) Labs Reviewed  CYTOLOGY, (ORAL, ANAL, URETHRAL) ANCILLARY ONLY    EKG   Radiology No results found.  Procedures Procedures (including critical care time)  Medications Ordered in UC Medications - No data to display  Initial Impression / Assessment and Plan / UC Course  I have reviewed the triage vital signs and the nursing notes.  Pertinent labs & imaging results that were available during my care of the patient were reviewed by me and considered in my medical decision making (see chart for details).     He did his self swab today.  Staff will notify him and treat  per protocol any positives.  He declines HIV and RPR screening Final Clinical Impressions(s) / UC Diagnoses   Final diagnoses:  Screening examination for STD (sexually transmitted disease)     Discharge Instructions      Staff will notify you if anything is positive on the swab   Drink more water     ED Prescriptions   None    PDMP not reviewed this encounter.   Zenia Resides, MD 02/12/22 [phone]

## 2022-02-12 NOTE — Discharge Instructions (Addendum)
Staff will notify you if anything is positive on the swab   Drink more water

## 2022-02-12 NOTE — ED Triage Notes (Signed)
Pt here for STD screening; unsure if has sx; pt sts sometimes some painful urination

## 2022-02-16 LAB — CYTOLOGY, (ORAL, ANAL, URETHRAL) ANCILLARY ONLY
Chlamydia: NEGATIVE
Comment: NEGATIVE
Comment: NEGATIVE
Comment: NORMAL
Neisseria Gonorrhea: NEGATIVE
Trichomonas: NEGATIVE

## 2022-04-19 ENCOUNTER — Other Ambulatory Visit: Payer: Self-pay

## 2022-04-19 ENCOUNTER — Ambulatory Visit
Admission: RE | Admit: 2022-04-19 | Discharge: 2022-04-19 | Disposition: A | Discharge status: Home / Self Care | Payer: Self-pay | Source: Ambulatory Visit | Attending: Physician Assistant | Admitting: Physician Assistant

## 2022-04-19 VITALS — BP 122/79 | HR 85 | Temp 97.2°F | Resp 18

## 2022-04-19 DIAGNOSIS — Z113 Encounter for screening for infections with a predominantly sexual mode of transmission: Secondary | ICD-10-CM | POA: Insufficient documentation

## 2022-04-19 DIAGNOSIS — Z202 Contact with and (suspected) exposure to infections with a predominantly sexual mode of transmission: Secondary | ICD-10-CM

## 2022-04-19 LAB — POCT URINALYSIS DIP (MANUAL ENTRY)
Bilirubin, UA: NEGATIVE
Blood, UA: NEGATIVE
Glucose, UA: NEGATIVE mg/dL
Ketones, POC UA: NEGATIVE mg/dL
Leukocytes, UA: NEGATIVE
Nitrite, UA: NEGATIVE
Protein Ur, POC: NEGATIVE mg/dL
Spec Grav, UA: 1.025 (ref 1.010–1.025)
Urobilinogen, UA: 0.2 E.U./dL
pH, UA: 7 (ref 5.0–8.0)

## 2022-04-19 NOTE — ED Provider Notes (Signed)
[name]    CSN: [id number] Arrival date & time: 04/19/22  1402      History   Chief Complaint Chief Complaint  Patient presents with   Dysuria    HPI Shawn Ferrell is a 21 y.o. male.   Patient here today for evaluation of lower abdominal pain with frequent urination as well as STD screening.  He reports that he has not had any burning with urination.  He denies any genital rash or lesions.  He does not report any known exposure.  He has not had any treatment for symptoms.  The history is provided by the patient.    History reviewed. No pertinent past medical history.  Patient Active Problem List   Diagnosis Date Noted   Generalized headaches 12/26/2016    History reviewed. No pertinent surgical history.     Home Medications    Prior to Admission medications   Medication Sig Start Date End Date Taking? Authorizing Provider  ranitidine (ZANTAC) 150 MG tablet  07/25/16 12/11/19  [provider]  albuterol (VENTOLIN HFA) 108 (90 Base) MCG/ACT inhaler Inhale 1 puff into the lungs every 6 (six) hours as needed for wheezing or shortness of breath. 12/18/21   Tomi Bamberger, PA-C  ARIPiprazole (ABILIFY) 15 MG tablet Take by mouth.    [provider]  cetirizine (ZYRTEC ALLERGY) 10 MG tablet Take 1 tablet (10 mg total) by mouth daily. 01/15/20   Avegno, Zachery Dakins, FNP  fluticasone (FLONASE) 50 MCG/ACT nasal spray instill 1 spray into each nostril once daily 07/27/16   [provider]  hydrocortisone 2.5 % cream apply to affected area three times a day 07/27/16   [provider]  traZODone (DESYREL) 50 MG tablet Take 50 mg by mouth.    [provider]  traZODone (DESYREL) 50 MG tablet Take by mouth.    [provider]    Family History Family History  Problem Relation Age of Onset   Hypertension Mother    Stroke Father    Hypertension Father     Social History Social History   Tobacco Use    Smoking status: Never   Smokeless tobacco: Never  Vaping Use   Vaping Use: Former  Substance Use Topics   Alcohol use: Yes   Drug use: Yes    Types: Marijuana     Allergies   Patient has no known allergies.   Review of Systems Review of Systems  Constitutional:  Negative for chills and fever.  Eyes:  Negative for discharge and redness.  Gastrointestinal:  Negative for abdominal pain, nausea and vomiting.  Genitourinary:  Positive for frequency. Negative for dysuria and genital sores.  Musculoskeletal:  Negative for back pain.  Neurological:  Negative for numbness.     Physical Exam Triage Vital Signs ED Triage Vitals  Enc Vitals Group     BP      Pulse      Resp      Temp      Temp src      SpO2      Weight      Height      Head Circumference      Peak Flow      Pain Score      Pain Loc      Pain Edu?      Excl. in GC?    No data found.  Updated Vital Signs BP 122/79 (BP Location: Left Arm)   Pulse 85  Temp (!) 97.2 F (36.2 C) (Oral)   Resp 18   SpO2 99%      Physical Exam Vitals and nursing note reviewed.  Constitutional:      General: He is not in acute distress.    Appearance: Normal appearance. He is not ill-appearing.  HENT:     Head: Normocephalic and atraumatic.  Eyes:     Conjunctiva/sclera: Conjunctivae normal.  Cardiovascular:     Rate and Rhythm: Normal rate.  Pulmonary:     Effort: Pulmonary effort is normal.  Neurological:     Mental Status: He is alert.  Psychiatric:        Mood and Affect: Mood normal.        Behavior: Behavior normal.        Thought Content: Thought content normal.      UC Treatments / Results  Labs (all labs ordered are listed, but only abnormal results are displayed) Labs Reviewed  URINE CULTURE  POCT URINALYSIS DIP (MANUAL ENTRY)  CYTOLOGY, (ORAL, ANAL, URETHRAL) ANCILLARY ONLY    EKG   Radiology No results found.  Procedures Procedures (including critical care time)  Medications  Ordered in UC Medications - No data to display  Initial Impression / Assessment and Plan / UC Course  I have reviewed the triage vital signs and the nursing notes.  Pertinent labs & imaging results that were available during my care of the patient were reviewed by me and considered in my medical decision making (see chart for details).    UA without concerning findings.  Will order urine culture given symptoms.  Screening for STDs also ordered as requested.  Will await results further recommendation.  Discussed that if he continues to have urinary frequency with no obvious cause that he should be seen by PCP and possibly referred to urology.  Patient expresses understanding and set up with PCP in office today.  Final Clinical Impressions(s) / UC Diagnoses   Final diagnoses:  Screening for STD (sexually transmitted disease)   Discharge Instructions   None    ED Prescriptions   None    PDMP not reviewed this encounter.   Tomi Bamberger, PA-C 04/19/22 1539

## 2022-04-19 NOTE — ED Triage Notes (Signed)
Pt here for lower abd pain with urination and asking for STD screening

## 2022-04-20 LAB — URINE CULTURE: Culture: NO GROWTH

## 2022-04-20 LAB — CYTOLOGY, (ORAL, ANAL, URETHRAL) ANCILLARY ONLY
Chlamydia: NEGATIVE
Comment: NEGATIVE
Comment: NEGATIVE
Comment: NORMAL
Neisseria Gonorrhea: NEGATIVE
Trichomonas: POSITIVE — AB

## 2022-04-21 ENCOUNTER — Telehealth (HOSPITAL_COMMUNITY): Payer: Self-pay | Admitting: Emergency Medicine

## 2022-04-21 MED ORDER — METRONIDAZOLE 500 MG PO TABS: 2000.0000 mg | ORAL_TABLET | Freq: Once | ORAL | 0 refills | Status: AC

## 2022-05-06 ENCOUNTER — Ambulatory Visit
Admission: RE | Admit: 2022-05-06 | Discharge: 2022-05-06 | Disposition: A | Discharge status: Home / Self Care | Payer: Self-pay | Source: Ambulatory Visit | Attending: Physician Assistant | Admitting: Physician Assistant

## 2022-05-06 VITALS — BP 136/82 | HR 76 | Temp 98.0°F | Resp 18

## 2022-05-06 DIAGNOSIS — R1030 Lower abdominal pain, unspecified: Secondary | ICD-10-CM | POA: Insufficient documentation

## 2022-05-06 LAB — POCT URINALYSIS DIP (MANUAL ENTRY)
Bilirubin, UA: NEGATIVE
Glucose, UA: NEGATIVE mg/dL
Ketones, POC UA: NEGATIVE mg/dL
Leukocytes, UA: NEGATIVE
Nitrite, UA: NEGATIVE
Protein Ur, POC: NEGATIVE mg/dL
Spec Grav, UA: 1.02 (ref 1.010–1.025)
Urobilinogen, UA: 0.2 E.U./dL
pH, UA: 7 (ref 5.0–8.0)

## 2022-05-06 NOTE — ED Triage Notes (Signed)
Pt c/o sharp abd pain  that occurs randomly. States has not resolved from a previous encounter.

## 2022-05-06 NOTE — ED Provider Notes (Signed)
[name]    CSN: [id number] Arrival date & time: 05/06/22  1510      History   Chief Complaint Chief Complaint  Patient presents with   Abdominal Pain    HPI Shawn Ferrell is a 21 y.o. male.   Patient here today for continued lower abdominal pain that comes and goes.  He reports that pain is present to his suprapubic area at times.  He denies any dysuria.  He recently tested positive for trichomonas and was treated for same.  He does not report any other symptoms or concerns.  The history is provided by the patient.  Abdominal Pain Associated symptoms: no chills, no diarrhea, no dysuria, no fever, no nausea, no shortness of breath and no vomiting     History reviewed. No pertinent past medical history.  Patient Active Problem List   Diagnosis Date Noted   Generalized headaches 12/26/2016    History reviewed. No pertinent surgical history.     Home Medications    Prior to Admission medications   Medication Sig Start Date End Date Taking? Authorizing Provider  ranitidine (ZANTAC) 150 MG tablet  07/25/16 12/11/19  [provider]  albuterol (VENTOLIN HFA) 108 (90 Base) MCG/ACT inhaler Inhale 1 puff into the lungs every 6 (six) hours as needed for wheezing or shortness of breath. 12/18/21   Tomi Bamberger, PA-C  ARIPiprazole (ABILIFY) 15 MG tablet Take by mouth.    [provider]  cetirizine (ZYRTEC ALLERGY) 10 MG tablet Take 1 tablet (10 mg total) by mouth daily. 01/15/20   Avegno, Zachery Dakins, FNP  fluticasone (FLONASE) 50 MCG/ACT nasal spray instill 1 spray into each nostril once daily 07/27/16   [provider]  hydrocortisone 2.5 % cream apply to affected area three times a day 07/27/16   [provider]  traZODone (DESYREL) 50 MG tablet Take 50 mg by mouth.    [provider]  traZODone (DESYREL) 50 MG tablet Take by mouth.    [provider]    Family History Family History  Problem Relation  Age of Onset   Hypertension Mother    Stroke Father    Hypertension Father     Social History Social History   Tobacco Use   Smoking status: Never   Smokeless tobacco: Never  Vaping Use   Vaping Use: Former  Substance Use Topics   Alcohol use: Yes   Drug use: Yes    Types: Marijuana     Allergies   Patient has no known allergies.   Review of Systems Review of Systems  Constitutional:  Negative for chills and fever.  Eyes:  Negative for discharge and redness.  Respiratory:  Negative for shortness of breath.   Gastrointestinal:  Positive for abdominal pain. Negative for diarrhea, nausea and vomiting.  Genitourinary:  Negative for dysuria and penile discharge.     Physical Exam Triage Vital Signs ED Triage Vitals [05/06/22 1518]  Enc Vitals Group     BP 136/82     Pulse Rate 76     Resp 18     Temp 98 F (36.7 C)     Temp Source Oral     SpO2 98 %     Weight      Height      Head Circumference      Peak Flow      Pain Score 8     Pain Loc      Pain Edu?  Excl. in GC?    No data found.  Updated Vital Signs BP 136/82 (BP Location: Left Arm)   Pulse 76   Temp 98 F (36.7 C) (Oral)   Resp 18   SpO2 98%     Physical Exam Vitals and nursing note reviewed.  Constitutional:      General: He is not in acute distress.    Appearance: Normal appearance. He is not ill-appearing.  HENT:     Head: Normocephalic and atraumatic.  Eyes:     Conjunctiva/sclera: Conjunctivae normal.  Cardiovascular:     Rate and Rhythm: Normal rate and regular rhythm.  Pulmonary:     Effort: Pulmonary effort is normal. No respiratory distress.     Breath sounds: Normal breath sounds. No wheezing, rhonchi or rales.  Abdominal:     General: Abdomen is flat. Bowel sounds are normal. There is no distension.     Palpations: Abdomen is soft.     Tenderness: There is no abdominal tenderness. There is no guarding or rebound.  Neurological:     Mental Status: He is alert.   Psychiatric:        Mood and Affect: Mood normal.        Behavior: Behavior normal.        Thought Content: Thought content normal.      UC Treatments / Results  Labs (all labs ordered are listed, but only abnormal results are displayed) Labs Reviewed  POCT URINALYSIS DIP (MANUAL ENTRY) - Abnormal; Notable for the following components:      Result Value   Blood, UA trace-intact (*)    All other components within normal limits  CYTOLOGY, (ORAL, ANAL, URETHRAL) ANCILLARY ONLY    EKG   Radiology No results found.  Procedures Procedures (including critical care time)  Medications Ordered in UC Medications - No data to display  Initial Impression / Assessment and Plan / UC Course  I have reviewed the triage vital signs and the nursing notes.  Pertinent labs & imaging results that were available during my care of the patient were reviewed by me and considered in my medical decision making (see chart for details).    UA without concerning findings.  Will repeat STD screening as this was actually positive the last time he had complaints of lower abdominal pain.  He is currently awaiting appointment to establish care with PCP and I recommended he discuss symptoms with PCP as he may need referral to urology, imaging, etc.  Encouraged follow-up sooner with any concerns.  Final Clinical Impressions(s) / UC Diagnoses   Final diagnoses:  Lower abdominal pain   Discharge Instructions   None    ED Prescriptions   None    PDMP not reviewed this encounter.   Tomi Bamberger, PA-C 05/06/22 1614

## 2022-05-09 LAB — CYTOLOGY, (ORAL, ANAL, URETHRAL) ANCILLARY ONLY
Chlamydia: NEGATIVE
Comment: NEGATIVE
Comment: NEGATIVE
Comment: NORMAL
Neisseria Gonorrhea: NEGATIVE
Trichomonas: NEGATIVE

## 2022-05-23 NOTE — Progress Notes (Signed)
Erroneous encounter-disregard

## 2022-05-31 ENCOUNTER — Encounter: Payer: Self-pay | Admitting: Family

## 2022-05-31 DIAGNOSIS — Z7689 Persons encountering health services in other specified circumstances: Secondary | ICD-10-CM

## 2022-06-16 ENCOUNTER — Ambulatory Visit
Admission: RE | Admit: 2022-06-16 | Discharge: 2022-06-16 | Disposition: A | Discharge status: Home / Self Care | Payer: Self-pay | Source: Ambulatory Visit | Attending: Physician Assistant | Admitting: Physician Assistant

## 2022-06-16 VITALS — BP 117/75 | HR 73 | Temp 98.4°F | Resp 18

## 2022-06-16 DIAGNOSIS — R350 Frequency of micturition: Secondary | ICD-10-CM | POA: Insufficient documentation

## 2022-06-16 DIAGNOSIS — Z113 Encounter for screening for infections with a predominantly sexual mode of transmission: Secondary | ICD-10-CM | POA: Insufficient documentation

## 2022-06-16 LAB — POCT URINALYSIS DIP (MANUAL ENTRY)
Bilirubin, UA: NEGATIVE
Glucose, UA: NEGATIVE mg/dL
Ketones, POC UA: NEGATIVE mg/dL
Leukocytes, UA: NEGATIVE
Nitrite, UA: NEGATIVE
Protein Ur, POC: NEGATIVE mg/dL
Spec Grav, UA: >=1.03 — AB (ref 1.010–1.025)
Urobilinogen, UA: 0.2 E.U./dL
pH, UA: 6.5 (ref 5.0–8.0)

## 2022-06-16 MED ORDER — ECONAZOLE NITRATE 1 % EX CREA: TOPICAL_CREAM | Freq: Every day | CUTANEOUS | 0 refills | Status: AC

## 2022-06-16 NOTE — ED Provider Notes (Signed)
[name]    CSN: [id number] Arrival date & time: 06/16/22  1314      History   Chief Complaint Chief Complaint  Patient presents with   Urinary Frequency    HPI Shawn Ferrell is a 21 y.o. male.   21 year old male presents with frequency.  Patient indicates for the past 2 weeks he has been having frequency, he denies any dysuria, he normally has lower back pain and he relates that this is not changed.  He denies any fever or chills.  Patient does indicate that he is concerned about possibly having an STI.  He indicates his last sexual contact was about 2 weeks ago and it was unprotected.  He denies having penile discharge.   Urinary Frequency    History reviewed. No pertinent past medical history.  Patient Active Problem List   Diagnosis Date Noted   Generalized headaches 12/26/2016    History reviewed. No pertinent surgical history.     Home Medications    Prior to Admission medications   Medication Sig Start Date End Date Taking? Authorizing Provider  econazole nitrate 1 % cream Apply topically daily. 06/16/22  Yes Ellsworth Lennox, PA-C  ranitidine (ZANTAC) 150 MG tablet  07/25/16 12/11/19  [provider]  albuterol (VENTOLIN HFA) 108 (90 Base) MCG/ACT inhaler Inhale 1 puff into the lungs every 6 (six) hours as needed for wheezing or shortness of breath. 12/18/21   Tomi Bamberger, PA-C  ARIPiprazole (ABILIFY) 15 MG tablet Take by mouth.    [provider]  cetirizine (ZYRTEC ALLERGY) 10 MG tablet Take 1 tablet (10 mg total) by mouth daily. 01/15/20   Avegno, Zachery Dakins, FNP  fluticasone (FLONASE) 50 MCG/ACT nasal spray instill 1 spray into each nostril once daily 07/27/16   [provider]  hydrocortisone 2.5 % cream apply to affected area three times a day 07/27/16   [provider]  traZODone (DESYREL) 50 MG tablet Take 50 mg by mouth.    [provider]  traZODone (DESYREL) 50 MG tablet Take by mouth.     [provider]    Family History Family History  Problem Relation Age of Onset   Hypertension Mother    Stroke Father    Hypertension Father     Social History Social History   Tobacco Use   Smoking status: Never   Smokeless tobacco: Never  Vaping Use   Vaping Use: Former  Substance Use Topics   Alcohol use: Yes   Drug use: Yes    Types: Marijuana     Allergies   Patient has no known allergies.   Review of Systems Review of Systems  Genitourinary:  Positive for frequency.     Physical Exam Triage Vital Signs ED Triage Vitals  Enc Vitals Group     BP 06/16/22 1338 117/75     Pulse Rate 06/16/22 1338 73     Resp 06/16/22 1338 18     Temp 06/16/22 1338 98.4 F (36.9 C)     Temp src --      SpO2 06/16/22 1338 97 %     Weight --      Height --      Head Circumference --      Peak Flow --      Pain Score 06/16/22 1337 0     Pain Loc --      Pain Edu? --      Excl. in GC? --    No data  found.  Updated Vital Signs BP 117/75   Pulse 73   Temp 98.4 F (36.9 C)   Resp 18   SpO2 97%   Visual Acuity Right Eye Distance:   Left Eye Distance:   Bilateral Distance:    Right Eye Near:   Left Eye Near:    Bilateral Near:     Physical Exam Constitutional:      Appearance: Normal appearance.  Genitourinary:    Comments: Genitals: Normal appearance without any ulcerations, swelling, redness.  No penile discharge. Neurological:     Mental Status: He is alert.      UC Treatments / Results  Labs (all labs ordered are listed, but only abnormal results are displayed) Labs Reviewed  POCT URINALYSIS DIP (MANUAL ENTRY) - Abnormal; Notable for the following components:      Result Value   Spec Grav, UA >=1.030 (*)    Blood, UA small (*)    All other components within normal limits  CYTOLOGY, (ORAL, ANAL, URETHRAL) ANCILLARY ONLY    EKG   Radiology No results found.  Procedures Procedures (including critical care time)  Medications  Ordered in UC Medications - No data to display  Initial Impression / Assessment and Plan / UC Course  I have reviewed the triage vital signs and the nursing notes.  Pertinent labs & imaging results that were available during my care of the patient were reviewed by me and considered in my medical decision making (see chart for details).     Plan: 1.  STI testing is pending and will be treated according to the results. 2.  Advised patient to follow-up PCP or return to urgent care if symptoms fail to improve. Final Clinical Impressions(s) / UC Diagnoses   Final diagnoses:  Frequency of urination  Routine screening for STI (sexually transmitted infection)     Discharge Instructions      Lab testing will be completed in 48 hours.  If you do not get a call from this office that indicates the labs are negative  You can go on MyChart to view the thoughts when it post in 48 hours. Advised to follow-up with PCP or return to urgent care if symptoms fail to improve.    ED Prescriptions     Medication Sig Dispense Auth. Provider   econazole nitrate 1 % cream Apply topically daily. 15 g Nyoka Lint, PA-C      PDMP not reviewed this encounter.   Nyoka Lint, PA-C 06/16/22 1415

## 2022-06-16 NOTE — ED Triage Notes (Signed)
Pt is present today with concerns for STD. Pt states that he is only experiencing urinary frequency.

## 2022-06-16 NOTE — Discharge Instructions (Addendum)
Lab testing will be completed in 48 hours.  If you do not get a call from this office that indicates the labs are negative  You can go on MyChart to view the thoughts when it post in 48 hours. Advised to follow-up with PCP or return to urgent care if symptoms fail to improve.

## 2022-06-17 LAB — CYTOLOGY, (ORAL, ANAL, URETHRAL) ANCILLARY ONLY
Chlamydia: NEGATIVE
Comment: NEGATIVE
Comment: NEGATIVE
Comment: NORMAL
Neisseria Gonorrhea: NEGATIVE
Trichomonas: NEGATIVE

## 2022-11-03 ENCOUNTER — Emergency Department (HOSPITAL_COMMUNITY)
Admission: EM | Admit: 2022-11-03 | Discharge: 2022-11-03 | Disposition: A | Discharge status: Home / Self Care | Payer: Self-pay | Attending: Emergency Medicine | Admitting: Emergency Medicine

## 2022-11-03 ENCOUNTER — Emergency Department (HOSPITAL_COMMUNITY): Payer: Self-pay

## 2022-11-03 ENCOUNTER — Other Ambulatory Visit: Payer: Self-pay

## 2022-11-03 DIAGNOSIS — I309 Acute pericarditis, unspecified: Secondary | ICD-10-CM | POA: Insufficient documentation

## 2022-11-03 LAB — CBC WITH DIFFERENTIAL/PLATELET
Abs Immature Granulocytes: 0.01 10*3/uL (ref 0.00–0.07)
Basophils Absolute: 0 10*3/uL (ref 0.0–0.1)
Basophils Relative: 0 %
Eosinophils Absolute: 0.1 10*3/uL (ref 0.0–0.5)
Eosinophils Relative: 1 %
HCT: 49.4 % (ref 39.0–52.0)
Hemoglobin: 15.7 g/dL (ref 13.0–17.0)
Immature Granulocytes: 0 %
Lymphocytes Relative: 35 %
Lymphs Abs: 2.6 10*3/uL (ref 0.7–4.0)
MCH: 28.3 pg (ref 26.0–34.0)
MCHC: 31.8 g/dL (ref 30.0–36.0)
MCV: 89.2 fL (ref 80.0–100.0)
Monocytes Absolute: 0.6 10*3/uL (ref 0.1–1.0)
Monocytes Relative: 8 %
Neutro Abs: 4.1 10*3/uL (ref 1.7–7.7)
Neutrophils Relative %: 56 %
Platelets: 185 10*3/uL (ref 150–400)
RBC: 5.54 MIL/uL (ref 4.22–5.81)
RDW: 13.3 % (ref 11.5–15.5)
WBC: 7.4 10*3/uL (ref 4.0–10.5)
nRBC: 0 % (ref 0.0–0.2)

## 2022-11-03 LAB — LIPASE, BLOOD: Lipase: 38 U/L (ref 11–51)

## 2022-11-03 LAB — COMPREHENSIVE METABOLIC PANEL
ALT: 19 U/L (ref 0–44)
AST: 24 U/L (ref 15–41)
Albumin: 4.4 g/dL (ref 3.5–5.0)
Alkaline Phosphatase: 75 U/L (ref 38–126)
Anion gap: 9 (ref 5–15)
BUN: 21 mg/dL — ABNORMAL HIGH (ref 6–20)
CO2: 27 mmol/L (ref 22–32)
Calcium: 9.6 mg/dL (ref 8.9–10.3)
Chloride: 101 mmol/L (ref 98–111)
Creatinine, Ser: 1.13 mg/dL (ref 0.61–1.24)
GFR, Estimated: >60 mL/min (ref >60)
Glucose, Bld: 79 mg/dL (ref 70–99)
Potassium: 3.6 mmol/L (ref 3.5–5.1)
Sodium: 137 mmol/L (ref 135–145)
Total Bilirubin: 0.7 mg/dL (ref 0.3–1.2)
Total Protein: 7.8 g/dL (ref 6.5–8.1)

## 2022-11-03 LAB — TROPONIN I (HIGH SENSITIVITY): Troponin I (High Sensitivity): 2 ng/L (ref <18)

## 2022-11-03 MED ORDER — NAPROXEN 500 MG PO TABS: 500.0000 mg | ORAL_TABLET | Freq: Two times a day (BID) | ORAL | 0 refills | Status: AC | PRN

## 2022-11-03 MED ORDER — KETOROLAC TROMETHAMINE 30 MG/ML IJ SOLN
30.0000 mg | Freq: Once | INTRAMUSCULAR | Status: AC
  Administered 2022-11-03: 30 mg via INTRAVENOUS
  Filled 2022-11-03: qty 1

## 2022-11-03 NOTE — ED Provider Triage Note (Signed)
Emergency Medicine Provider Triage Evaluation Note  Tashaun Lister , a 22 y.o. male  was evaluated in triage.  Pt complains of occasionally right sided chest pain, worse with movement, for the past two days. Denies any recent FLS, fever, or travel. No trauma to the area. Denies any SOB.  Review of Systems  Positive:  Negative:   Physical Exam  BP (!) 135/94   Pulse 93   Temp 98.8 F (37.1 C)   Resp 16   Wt 52 kg   SpO2 100%  Gen:   Awake, no distress   Resp:  Normal effort  MSK:   Moves extremities without difficulty  Other:  Mild right sided chest wall tenderness. No crepitus.   Medical Decision Making  Medically screening exam initiated at 6:16 PM.  Appropriate orders placed.  Wessley Montesi was informed that the remainder of the evaluation will be completed by another provider, this initial triage assessment does not replace that evaluation, and the importance of remaining in the ED until their evaluation is complete.  Cardiac workup.    Sherrell Puller, [place] 11/03/22 [id number]

## 2022-11-03 NOTE — ED Triage Notes (Signed)
C/o right sided cp radiating into right shoulder x2 days. Denies sob.  Pain worse with movement.

## 2022-11-03 NOTE — Discharge Instructions (Addendum)
Take Naproxen 500 twice a day as needed  If symptoms continue, see your PCP or healthcare provider for possible referral to the cardiologist.

## 2022-11-03 NOTE — ED Provider Notes (Signed)
[facility] Provider Note   CSN: [id number] Arrival date & time: 11/03/22  1728     History  Chief Complaint  Patient presents with   Chest Pain    Shawn Ferrell is a 22 y.o. male.  Patient p/f chest pain onset 2 days ago, never happened before. Sharp with certain movements. Right shoulder pain present as well. Patient denies any trauma to the chest or the right shoulder.  Patient denies fever, dyspnea, nausea, vomiting.  He does report that he has had some chills as of late.  He has a family member who was recently sick but he denies any recent illness.  No new medications.  The history is provided by the patient.       Home Medications Prior to Admission medications   Medication Sig Start Date End Date Taking? Authorizing Provider  naproxen (NAPROSYN) 500 MG tablet Take 1 tablet (500 mg total) by mouth 2 (two) times daily as needed for up to 15 days. 11/03/22 11/18/22 Yes Erskine Emery, MD  ranitidine (ZANTAC) 150 MG tablet  07/25/16 12/11/19  [provider]  albuterol (VENTOLIN HFA) 108 (90 Base) MCG/ACT inhaler Inhale 1 puff into the lungs every 6 (six) hours as needed for wheezing or shortness of breath. 12/18/21   Francene Finders, PA-C  ARIPiprazole (ABILIFY) 15 MG tablet Take by mouth.    [provider]  cetirizine (ZYRTEC ALLERGY) 10 MG tablet Take 1 tablet (10 mg total) by mouth daily. 01/15/20   Avegno, Darrelyn Hillock, FNP  econazole nitrate 1 % cream Apply topically daily. 06/16/22   Nyoka Lint, PA-C  fluticasone [facility]) 50 MCG/ACT nasal spray instill 1 spray into each nostril once daily 07/27/16   [provider]  hydrocortisone 2.5 % cream apply to affected area three times a day 07/27/16   [provider]  traZODone (DESYREL) 50 MG tablet Take 50 mg by mouth.    [provider]  traZODone (DESYREL) 50 MG tablet Take by mouth.    [provider]      Allergies     Patient has no known allergies.    Review of Systems   Review of Systems  Constitutional:  Negative for chills and fever.  HENT:  Negative for ear pain and sore throat.   Eyes:  Negative for pain and visual disturbance.  Respiratory:  Negative for cough and shortness of breath.   Cardiovascular:  Positive for chest pain. Negative for palpitations and leg swelling.  Gastrointestinal:  Negative for abdominal pain and vomiting.  Genitourinary:  Negative for dysuria and hematuria.  Musculoskeletal:  Negative for arthralgias and back pain.  Skin:  Negative for color change and rash.  Neurological:  Negative for seizures and syncope.  All other systems reviewed and are negative.   Physical Exam Updated Vital Signs BP 110/87   Pulse 78   Temp 98.8 F (37.1 C)   Resp 15   Wt 52 kg   SpO2 99%  Physical Exam Vitals and nursing note reviewed.  Constitutional:      General: He is not in acute distress.    Appearance: He is well-developed.  HENT:     Head: Normocephalic and atraumatic.  Eyes:     Conjunctiva/sclera: Conjunctivae normal.  Cardiovascular:     Rate and Rhythm: Normal rate and regular rhythm.     Heart sounds: Normal heart sounds. Heart sounds not distant. No murmur heard. Pulmonary:     Effort:  Pulmonary effort is normal. No respiratory distress.     Breath sounds: Normal breath sounds.  Abdominal:     Palpations: Abdomen is soft.     Tenderness: There is no abdominal tenderness.  Musculoskeletal:        General: No swelling.     Cervical back: Neck supple.  Skin:    General: Skin is warm and dry.     Capillary Refill: Capillary refill takes less than 2 seconds.  Neurological:     Mental Status: He is alert.  Psychiatric:        Mood and Affect: Mood normal.    ED Results / Procedures / Treatments   Labs (all labs ordered are listed, but only abnormal results are displayed) Labs Reviewed  COMPREHENSIVE METABOLIC PANEL - Abnormal; Notable for the  following components:      Result Value   BUN 21 (*)    All other components within normal limits  LIPASE, BLOOD  CBC WITH DIFFERENTIAL/PLATELET  TROPONIN I (HIGH SENSITIVITY)  TROPONIN I (HIGH SENSITIVITY)    EKG EKG Interpretation  Date/Time:  Thursday November 03 2022 17:42:18 EST Ventricular Rate:  83 PR Interval:  122 QRS Duration: 80 QT Interval:  338 QTC Calculation: 398 R Axis:   91 Text Interpretation: Sinus rhythm Borderline right axis deviation ST elevation suggests acute pericarditis No previous ECGs available Confirmed by Shawn Ferrell [phone]) on 11/03/2022 7:11:05 PM  Radiology DG Chest 2 View  Result Date: 11/03/2022 CLINICAL DATA:  Right chest pain radiating into the right shoulder for the past 2 days. The pain is worse with movement. EXAM: CHEST - 2 VIEW COMPARISON:  None Available. FINDINGS: Normal sized heart. Mild peribronchial thickening. Clear lungs with normal vascularity. Normal appearing bones. IMPRESSION: Mild bronchitic changes. Electronically Signed   By: Claudie Revering M.D.   On: 11/03/2022 18:34    Procedures Ultrasound ED Echo  Date/Time: 11/03/2022 8:53 PM  Performed by: Erskine Emery, MD Authorized by: Drenda Freeze, MD   Procedure details:    Indications: chest pain     Views: subxiphoid, parasternal long axis view, parasternal short axis view, apical 4 chamber view and IVC view     Images: not archived     Limitations:  Positioning Findings:    Pericardium: no pericardial effusion     LV Function: normal (>50% EF)     RV Diameter: normal     IVC: normal   Impression:    Impression: normal       Medications Ordered in ED Medications  ketorolac (TORADOL) 30 MG/ML injection 30 mg (30 mg Intravenous Given 11/03/22 2002)    ED Course/ Medical Decision Making/ A&P                             Medical Decision Making Medical Decision Making:   Shawn Ferrell is a 22 y.o. male who presented to the ED today with chest pain  detailed above.    Complete initial physical exam performed, notably the patient was dynamically stable with no abnormalities on physical examination.    Reviewed and confirmed nursing documentation for past medical history, family history, social history.    Initial Assessment:   With the patient's presentation of chest pain, most likely diagnosis is pericarditis given presentation and EKG findings. Other diagnoses were considered including (but not limited to) STEMI, atypical chest pain, PE (PERC 0), MI (HEART score low risk), PNA (CXR unremarkable), pericardial  effusion (Echo without effusion and normal LVEF). These are considered less likely due to history of present illness and physical exam findings.   This is most consistent with an acute complicated illness  Initial Plan:   Screening labs including CBC and Metabolic panel, trop, lipase CXR to evaluate for structural/infectious intrathoracic pathology.  EKG to evaluate for cardiac pathology Echo performed at bedside  Objective evaluation as below reviewed   Initial Study Results:   Laboratory  All laboratory results reviewed without evidence of clinically relevant pathology.    EKG EKG was reviewed independently. Rate, rhythm, axis, intervals all examined and with diffuse ST elevation consistent with pericarditis   Radiology:  All images reviewed independently. Agree with radiology report at this time.   Chest XR Result Date: 11/03/2022 IMPRESSION: Mild bronchitic changes.    Final Assessment and Plan:   Trops negative without evidence of pericardial effusion or abnormalities in EF on echo.  He can continue to treat his pain outpatient with naproxen 500 twice daily as needed for pain.  We provided him with Toradol in the ED.  Discussed with the patient that if he continues to have pain, he needs to see a healthcare provider, PCP as he will likely need to see cardiology. Per PERC and heart score, no current concerns for PE or  major cardiac event. Patient verbalized understanding and return precautions.   Clinical Impression: Pericarditis   Discharge    Risk Prescription drug management.           Final Clinical Impression(s) / ED Diagnoses Final diagnoses:  Acute pericarditis, unspecified type    Rx / DC Orders ED Discharge Orders          Ordered    naproxen (NAPROSYN) 500 MG tablet  2 times daily PRN        11/03/22 2051              Erskine Emery, MD 11/03/22 2106    Drenda Freeze, MD 11/05/22 1500

## 2023-02-19 ENCOUNTER — Emergency Department (HOSPITAL_BASED_OUTPATIENT_CLINIC_OR_DEPARTMENT_OTHER): Payer: Self-pay

## 2023-02-19 ENCOUNTER — Emergency Department (HOSPITAL_BASED_OUTPATIENT_CLINIC_OR_DEPARTMENT_OTHER)
Admission: EM | Admit: 2023-02-19 | Discharge: 2023-02-19 | Disposition: A | Discharge status: Home / Self Care | Payer: Self-pay | Attending: Emergency Medicine | Admitting: Emergency Medicine

## 2023-02-19 ENCOUNTER — Other Ambulatory Visit: Payer: Self-pay

## 2023-02-19 ENCOUNTER — Encounter (HOSPITAL_BASED_OUTPATIENT_CLINIC_OR_DEPARTMENT_OTHER): Payer: Self-pay

## 2023-02-19 DIAGNOSIS — R21 Rash and other nonspecific skin eruption: Secondary | ICD-10-CM | POA: Insufficient documentation

## 2023-02-19 DIAGNOSIS — R079 Chest pain, unspecified: Secondary | ICD-10-CM | POA: Insufficient documentation

## 2023-02-19 MED ORDER — PENICILLIN G BENZATHINE 1200000 UNIT/2ML IM SUSY
2.4000 10*6.[IU] | PREFILLED_SYRINGE | Freq: Once | INTRAMUSCULAR | Status: AC
  Administered 2023-02-19: 2.4 10*6.[IU] via INTRAMUSCULAR
  Filled 2023-02-19: qty 4

## 2023-02-19 MED ORDER — DOXYCYCLINE HYCLATE 100 MG PO CAPS: 100.0000 mg | ORAL_CAPSULE | Freq: Two times a day (BID) | ORAL | 0 refills | Status: DC

## 2023-02-19 MED ORDER — DOXYCYCLINE HYCLATE 100 MG PO TABS
100.0000 mg | ORAL_TABLET | Freq: Once | ORAL | Status: AC
  Administered 2023-02-19: 100 mg via ORAL
  Filled 2023-02-19: qty 1

## 2023-02-19 NOTE — Discharge Instructions (Addendum)
I am not sure of the exact cause of your rash.  I am going to start you on antibiotics and see if it clears it up.  I think it would not be unreasonable to try athlete's foot treatment for your feet.  Please keep an eye on what you are applying to your hands and feet as it may be causing this rash.  I placed a referral for the cardiology office to call you to set up an appointment.

## 2023-02-19 NOTE — ED Provider Notes (Signed)
[place] EMERGENCY DEPARTMENT AT [facility] Provider Note   CSN: [id number] Arrival date & time: 02/19/23  1657     History  Chief Complaint  Patient presents with   Skin Problem    Shawn Ferrell is a 22 y.o. male.  22 yo M with a cc of a rash to the hands and feet.  Going on for about 3 days.  Started in his feet and then moved to his hands.  Does not hurt or itch.  Denies rash anywhere else.  He denies any recent exposure to a chemical or any new soaps or detergents.  He denies any tick bites.  He also is complaining of chest pain.  Tells me that this has been off and on for months.  He was seen at some point and was told that he had inflammation around his heart and has wondered if that has persisted.  Usually pinpoint usually worse with twisting turning moving.  Tends to get better on its own.        Home Medications Prior to Admission medications   Medication Sig Start Date End Date Taking? Authorizing Provider  doxycycline (VIBRAMYCIN) 100 MG capsule Take 1 capsule (100 mg total) by mouth 2 (two) times daily. One po bid x 7 days 02/19/23  Yes Melene Plan, DO  ranitidine (ZANTAC) 150 MG tablet  07/25/16 12/11/19  [provider]  albuterol (VENTOLIN HFA) 108 (90 Base) MCG/ACT inhaler Inhale 1 puff into the lungs every 6 (six) hours as needed for wheezing or shortness of breath. 12/18/21   Tomi Bamberger, PA-C  ARIPiprazole (ABILIFY) 15 MG tablet Take by mouth.    [provider]  cetirizine (ZYRTEC ALLERGY) 10 MG tablet Take 1 tablet (10 mg total) by mouth daily. 01/15/20   Avegno, Zachery Dakins, FNP  econazole nitrate 1 % cream Apply topically daily. 06/16/22   Ellsworth Lennox, PA-C  fluticasone [facility]) 50 MCG/ACT nasal spray instill 1 spray into each nostril once daily 07/27/16   [provider]  hydrocortisone 2.5 % cream apply to affected area three times a day 07/27/16   [provider]  traZODone (DESYREL) 50 MG tablet Take 50 mg  by mouth.    [provider]  traZODone (DESYREL) 50 MG tablet Take by mouth.    [provider]      Allergies    Patient has no known allergies.    Review of Systems   Review of Systems  Physical Exam Updated Vital Signs BP 121/79 (BP Location: Right Arm)   Pulse 94   Temp 98.7 F (37.1 C) (Oral)   Resp 15   Ht 5\' 5"  (1.651 m)   Wt 59 kg   SpO2 99%   BMI 21.63 kg/m  Physical Exam Vitals and nursing note reviewed.  Constitutional:      Appearance: He is well-developed.  HENT:     Head: Normocephalic and atraumatic.  Eyes:     Pupils: Pupils are equal, round, and reactive to light.  Neck:     Vascular: No JVD.  Cardiovascular:     Rate and Rhythm: Normal rate and regular rhythm.     Heart sounds: No murmur heard.    No friction rub. No gallop.  Pulmonary:     Effort: No respiratory distress.     Breath sounds: No wheezing.  Abdominal:     General: There is no distension.     Tenderness: There is no abdominal tenderness. There is no guarding  or rebound.  Musculoskeletal:        General: Normal range of motion.     Cervical back: Normal range of motion and neck supple.  Skin:    Coloration: Skin is not pale.     Findings: No rash.     Comments: Desquamating rash to the hands and feet mostly on the dorsum of both.  There are some erythema to the palmar surface of the hands which also appear quite dry.  He has a small dark discoloration to his left palm that he tells me is an old pencil mark.  Neurological:     Mental Status: He is alert and oriented to person, place, and time.  Psychiatric:        Behavior: Behavior normal.     ED Results / Procedures / Treatments   Labs (all labs ordered are listed, but only abnormal results are displayed) Labs Reviewed  RPR  HIV ANTIBODY (ROUTINE TESTING W REFLEX)  GC/CHLAMYDIA PROBE AMP ([place]) NOT AT [facility]    EKG EKG Interpretation  Date/Time:  Sunday February 19 2023 17:08:05 EDT Ventricular  Rate:  97 PR Interval:  120 QRS Duration: 82 QT Interval:  314 QTC Calculation: 398 R Axis:   92 Text Interpretation: Normal sinus rhythm Rightward axis Nonspecific ST and T wave abnormality Abnormal ECG diffuse st elevation now resolved Otherwise no significant change Confirmed by Melene Plan [phone]) on 02/19/2023 5:30:01 PM  Radiology DG Chest Port 1 View  Result Date: 02/19/2023 CLINICAL DATA:  Chest pain EXAM: PORTABLE CHEST 1 VIEW COMPARISON:  November 03, 2022 FINDINGS: The heart size and mediastinal contours are within normal limits. Both lungs are clear. The visualized skeletal structures are unremarkable. IMPRESSION: No active disease. Electronically Signed   By: Gerome Sam III M.D.   On: 02/19/2023 17:49    Procedures Procedures    Medications Ordered in ED Medications  penicillin g benzathine (BICILLIN LA) 1200000 UNIT/2ML injection 2.4 Million Units (has no administration in time range)  doxycycline (VIBRA-TABS) tablet 100 mg (has no administration in time range)    ED Course/ Medical Decision Making/ A&P                             Medical Decision Making Amount and/or Complexity of Data Reviewed Labs: ordered. Radiology: ordered.  Risk Prescription drug management.   22 yo M with a chief complaint of a rash to his hands and feet.  This does seem to involve the palms and soles.  Initially I thought it could be tinea pedis that he had somehow gotten on his hands.  It does not itch however and does not have the typical appearance of the same.  On my record review the patient has had multiple ED visits for STDs.  I wonder if this could be syphilis.  Will treat presumptively here sent off for testing.  Also will test for gonorrhea and chlamydia and HIV.  He has a secondary complaints of chest pain.  This been going on for months.  Does not have it actively.  EKG nonischemic chest x-ray independently interpreted by me without focal infiltrate or pneumothorax.  Sounds  musculoskeletal by history and physical, he did have a ED visit where he was diagnosed with pericarditis.  I wonder if he is having chronic symptoms from this though does not sound real typical for that.  I will refer him for cardiology review.  6:21 PM:  I  have discussed the diagnosis/risks/treatment options with the patient.  Evaluation and diagnostic testing in the emergency department does not suggest an emergent condition requiring admission or immediate intervention beyond what has been performed at this time.  They will follow up with PCP, Cards. We also discussed returning to the ED immediately if new or worsening sx occur. We discussed the sx which are most concerning (e.g., sudden worsening pain, fever, inability to tolerate by mouth) that necessitate immediate return. Medications administered to the patient during their visit and any new prescriptions provided to the patient are listed below.  Medications given during this visit Medications  penicillin g benzathine (BICILLIN LA) 1200000 UNIT/2ML injection 2.4 Million Units (has no administration in time range)  doxycycline (VIBRA-TABS) tablet 100 mg (has no administration in time range)     The patient appears reasonably screen and/or stabilized for discharge and I doubt any other medical condition or other [facility] requiring further screening, evaluation, or treatment in the ED at this time prior to discharge.          Final Clinical Impression(s) / ED Diagnoses Final diagnoses:  Rash and nonspecific skin eruption  Nonspecific chest pain    Rx / DC Orders ED Discharge Orders          Ordered    doxycycline (VIBRAMYCIN) 100 MG capsule  2 times daily        02/19/23 1757    Ambulatory referral to Cardiology       Comments: If you have not heard from the Cardiology office within the next 72 hours please call [phone].   02/19/23 1800              Melene Plan, DO 02/19/23 1821

## 2023-02-19 NOTE — ED Triage Notes (Signed)
Patient here POV from Home.  Notes Peeling to Bilateral hands and Feet that began a few days ago. States "while I'm here I want this pain looked at " referring to the Pain in Chest the Patient has been having for months.   NAD Noted during Triage. A&Ox4. GCS 15. Ambulatory.

## 2023-02-20 LAB — GC/CHLAMYDIA PROBE AMP (~~LOC~~) NOT AT ARMC
Chlamydia: NEGATIVE
Comment: NEGATIVE
Comment: NORMAL
Neisseria Gonorrhea: NEGATIVE

## 2023-02-20 LAB — RPR: RPR Ser Ql: NONREACTIVE

## 2023-02-23 ENCOUNTER — Ambulatory Visit: Payer: Self-pay | Attending: Cardiovascular Disease | Admitting: Cardiovascular Disease

## 2023-02-23 ENCOUNTER — Encounter: Payer: Self-pay | Admitting: Cardiovascular Disease

## 2023-02-23 VITALS — BP 108/72 | HR 81 | Ht 65.0 in | Wt 135.6 lb

## 2023-02-23 DIAGNOSIS — R0789 Other chest pain: Secondary | ICD-10-CM

## 2023-02-23 DIAGNOSIS — R079 Chest pain, unspecified: Secondary | ICD-10-CM

## 2023-02-23 NOTE — Progress Notes (Signed)
02/23/2023 Shawn Ferrell   [birth year]  [id number]  Primary Physician Pcp, No Primary Cardiologist: Runell Gess MD Milagros Loll, [place], [place]  HPI:  Shawn Ferrell is a 22 y.o. thin and fit appearing single African-American male with no children who is accompanied by his girlfriend Shawn Ferrell.  He works as a Engineer, maintenance (IT).  He smokes 2 to 3 cigars a day.  He has no other cardiovascular risk factors.  Both his parents are deceased for unclear reasons.  He is never had a heart attack or stroke.  He was evaluated in the emergency room by Dr. Jena Gauss 11/03/2022 with atypical chest wall pain.  A bedside echo showed no pericardial effusion.  Enzymes were negative.  The presumptive diagnosis was pericarditis and he was treated with Naprosyn for 2 weeks and his pain ultimately resolved.  He says the pain is somewhat positional and actually sounds more musculoskeletal than anything else.  Since that time he is get had occasional recurrent pain which is fleeting.   Current Meds  Medication Sig   albuterol (VENTOLIN HFA) 108 (90 Base) MCG/ACT inhaler Inhale 1 puff into the lungs every 6 (six) hours as needed for wheezing or shortness of breath.   ARIPiprazole (ABILIFY) 15 MG tablet Take by mouth.   cetirizine (ZYRTEC ALLERGY) 10 MG tablet Take 1 tablet (10 mg total) by mouth daily.   doxycycline (VIBRAMYCIN) 100 MG capsule Take 1 capsule (100 mg total) by mouth 2 (two) times daily. One po bid x 7 days   econazole nitrate 1 % cream Apply topically daily.   fluticasone (FLONASE) 50 MCG/ACT nasal spray instill 1 spray into each nostril once daily   hydrocortisone 2.5 % cream apply to affected area three times a day   traZODone (DESYREL) 50 MG tablet Take 50 mg by mouth.     No Known Allergies  Social History   Socioeconomic History   Marital status: Single    Spouse name: Not on file   Number of children: Not on file   Years of education: Not on file   Highest education  level: Not on file  Occupational History   Not on file  Tobacco Use   Smoking status: Every Day    Types: Cigars   Smokeless tobacco: Never  Vaping Use   Vaping Use: Former  Substance and Sexual Activity   Alcohol use: Yes    Comment: Occ   Drug use: Not Currently    Types: Marijuana   Sexual activity: Yes    Birth control/protection: None  Other Topics Concern   Not on file  Social History Narrative   Lives with mom only. Not employed currently. Grades above average   Social Determinants of Health   Financial Resource Strain: Not on file  Food Insecurity: Not on file  Transportation Needs: Not on file  Physical Activity: Not on file  Stress: Not on file  Social Connections: Not on file  Intimate Partner Violence: Not on file     Review of Systems: General: negative for chills, fever, night sweats or weight changes.  Cardiovascular: negative for chest pain, dyspnea on exertion, edema, orthopnea, palpitations, paroxysmal nocturnal dyspnea or shortness of breath Dermatological: negative for rash Respiratory: negative for cough or wheezing Urologic: negative for hematuria Abdominal: negative for nausea, vomiting, diarrhea, bright red blood per rectum, melena, or hematemesis Neurologic: negative for visual changes, syncope, or dizziness All other systems reviewed and are otherwise negative except as noted above.  Blood pressure 108/72, pulse 81, height 5\' 5"  (1.651 m), weight 135 lb 9.6 oz (61.5 kg).  General appearance: alert and no distress Neck: no adenopathy, no carotid bruit, no JVD, supple, symmetrical, trachea midline, and thyroid not enlarged, symmetric, no tenderness/mass/nodules Lungs: clear to auscultation bilaterally Heart: regular rate and rhythm, S1, S2 normal, no murmur, click, rub or gallop Extremities: extremities normal, atraumatic, no cyanosis or edema Pulses: 2+ and symmetric Skin: Skin color, texture, turgor normal. No rashes or  lesions Neurologic: Grossly normal  EKG sinus rhythm at 81 with nonspecific ST and T wave changes, diffuse J-point elevation consistent with possible pericarditis versus early repolarization changes.  I personally reviewed this EKG.  ASSESSMENT AND PLAN:   Atypical chest pain Satoru was referred by the emergency room for evaluation of atypical chest pain.  He was seen on 11/03/2022 for the symptoms.  At bedside echo showed no pericardial effusion.  Enzymes were negative.  EKG did show some ST and T wave changes which could have been consistent with paradoxus or normal voltage for his age.  He was treated for pericarditis with Naprosyn twice daily for 2 weeks and his pain resolved.  He gets occasional atypical chest pain now which is somewhat positional.  At this point, I do not think his pain is cardiovascular nature.  I suspect it is musculoskeletal.  No further workup is necessary at this time.     Runell Gess MD FACP,FACC,FAHA, [facility] 02/23/2023 3:09 PM

## 2023-02-23 NOTE — Assessment & Plan Note (Signed)
Shawn Ferrell was referred by the emergency room for evaluation of atypical chest pain.  He was seen on 11/03/2022 for the symptoms.  At bedside echo showed no pericardial effusion.  Enzymes were negative.  EKG did show some ST and T wave changes which could have been consistent with paradoxus or normal voltage for his age.  He was treated for pericarditis with Naprosyn twice daily for 2 weeks and his pain resolved.  He gets occasional atypical chest pain now which is somewhat positional.  At this point, I do not think his pain is cardiovascular nature.  I suspect it is musculoskeletal.  No further workup is necessary at this time.

## 2023-02-23 NOTE — Patient Instructions (Signed)
Medication Instructions:  The current medical regimen is effective;  continue present plan and medications.  *If you need a refill on your cardiac medications before your next appointment, please call your pharmacy*   Follow-Up: At [facility], you and your health needs are our priority.  As part of our continuing mission to provide you with exceptional heart care, we have created designated Provider Care Teams.  These Care Teams include your primary Cardiologist (physician) and Advanced Practice Providers (APPs -  Physician Assistants and Nurse Practitioners) who all work together to provide you with the care you need, when you need it.  We recommend signing up for the patient portal called "MyChart".  Sign up information is provided on this After Visit Summary.  MyChart is used to connect with patients for Virtual Visits (Telemedicine).  Patients are able to view lab/test results, encounter notes, upcoming appointments, etc.  Non-urgent messages can be sent to your provider as well.   To learn more about what you can do with MyChart, go to [email or web address].    Your next appointment:   6 month(s)  Provider:   Edd Fabian, FNP, Marjie Skiff, PA-C, Azalee Course, PA-C, or Bernadene Person, NP    Then, Nanetta Batty, MD will see you back as needed.

## 2023-02-27 ENCOUNTER — Emergency Department (HOSPITAL_BASED_OUTPATIENT_CLINIC_OR_DEPARTMENT_OTHER)
Admission: EM | Admit: 2023-02-27 | Discharge: 2023-02-27 | Disposition: A | Discharge status: Home / Self Care | Payer: Self-pay | Attending: Emergency Medicine | Admitting: Emergency Medicine

## 2023-02-27 ENCOUNTER — Other Ambulatory Visit: Payer: Self-pay

## 2023-02-27 ENCOUNTER — Encounter (HOSPITAL_BASED_OUTPATIENT_CLINIC_OR_DEPARTMENT_OTHER): Payer: Self-pay

## 2023-02-27 DIAGNOSIS — R1030 Lower abdominal pain, unspecified: Secondary | ICD-10-CM | POA: Insufficient documentation

## 2023-02-27 DIAGNOSIS — R300 Dysuria: Secondary | ICD-10-CM

## 2023-02-27 LAB — URINALYSIS, ROUTINE W REFLEX MICROSCOPIC
Bacteria, UA: NONE SEEN
Bilirubin Urine: NEGATIVE
Glucose, UA: NEGATIVE mg/dL
Ketones, ur: NEGATIVE mg/dL
Nitrite: NEGATIVE
Specific Gravity, Urine: 1.03 (ref 1.005–1.030)
pH: 7 (ref 5.0–8.0)

## 2023-02-27 MED ORDER — DOXYCYCLINE HYCLATE 100 MG PO TABS: 100.0000 mg | ORAL_TABLET | Freq: Two times a day (BID) | ORAL | 0 refills | Status: AC

## 2023-02-27 MED ORDER — METRONIDAZOLE 500 MG PO TABS
2000.0000 mg | ORAL_TABLET | Freq: Once | ORAL | Status: AC
  Administered 2023-02-27: 2000 mg via ORAL
  Filled 2023-02-27: qty 4

## 2023-02-27 MED ORDER — CEFTRIAXONE SODIUM 500 MG IJ SOLR
500.0000 mg | Freq: Once | INTRAMUSCULAR | Status: AC
  Administered 2023-02-27: 500 mg via INTRAMUSCULAR
  Filled 2023-02-27: qty 500

## 2023-02-27 MED ORDER — LIDOCAINE HCL (PF) 1 % IJ SOLN
1.0000 mL | Freq: Once | INTRAMUSCULAR | Status: AC
  Administered 2023-02-27: 2 mL
  Filled 2023-02-27: qty 5

## 2023-02-27 NOTE — ED Provider Notes (Signed)
[place] EMERGENCY DEPARTMENT AT [facility] Provider Note   CSN: [id number] Arrival date & time: 02/27/23  2052     History  Chief Complaint  Patient presents with   Abdominal Pain    Shawn Ferrell is a 22 y.o. male.  Patient presents to the emergency department for evaluation of dysuria.  Patient states he has had symptoms for about 3 days.  He reports recent sexual contact with a partner who tested positive for chlamydia and trichomonas.  He denies testicular pain or swelling.  He reports some mild discomfort in the lower abdomen.  No fevers, nausea, vomiting, diarrhea.  No new skin lesions.  He has noted very small fine bumps on his penile shaft for about 1 year, these have not been changing.  He has not noted any definite discharge.  He would like to be treated for sexually transmitted infection.  He states that he recently tested negative for HIV and syphilis.       Home Medications Prior to Admission medications   Medication Sig Start Date End Date Taking? Authorizing Provider  doxycycline (VIBRA-TABS) 100 MG tablet Take 1 tablet (100 mg total) by mouth 2 (two) times daily for 7 days. 02/27/23 03/06/23 Yes Renne Crigler, PA-C  ranitidine (ZANTAC) 150 MG tablet  07/25/16 12/11/19  [provider]  albuterol (VENTOLIN HFA) 108 (90 Base) MCG/ACT inhaler Inhale 1 puff into the lungs every 6 (six) hours as needed for wheezing or shortness of breath. 12/18/21   Tomi Bamberger, PA-C  ARIPiprazole (ABILIFY) 15 MG tablet Take by mouth.    [provider]  cetirizine (ZYRTEC ALLERGY) 10 MG tablet Take 1 tablet (10 mg total) by mouth daily. 01/15/20   Avegno, Zachery Dakins, FNP  econazole nitrate 1 % cream Apply topically daily. 06/16/22   Ellsworth Lennox, PA-C  fluticasone [facility]) 50 MCG/ACT nasal spray instill 1 spray into each nostril once daily 07/27/16   [provider]  hydrocortisone 2.5 % cream apply to affected area three times a day 07/27/16    [provider]  traZODone (DESYREL) 50 MG tablet Take 50 mg by mouth.    [provider]      Allergies    Patient has no known allergies.    Review of Systems   Review of Systems  Physical Exam Updated Vital Signs BP 131/77 (BP Location: Right Arm)   Pulse 85   Temp 98.9 F (37.2 C) (Oral)   Resp 18   Ht 5\' 5"  (1.651 m)   Wt 61.5 kg   SpO2 100%   BMI 22.56 kg/m   Physical Exam Vitals and nursing note reviewed.  Constitutional:      Appearance: He is well-developed.  HENT:     Head: Normocephalic and atraumatic.  Eyes:     Conjunctiva/sclera: Conjunctivae normal.  Pulmonary:     Effort: No respiratory distress.  Abdominal:     Tenderness: There is no abdominal tenderness.     Comments: Abdomen is soft and nontender without rebound or guarding  Genitourinary:    Penis: Normal. No discharge.      Testes:        Right: Mass, tenderness or swelling not present.        Left: Mass, tenderness or swelling not present.  Musculoskeletal:     Cervical back: Normal range of motion and neck supple.  Skin:    General: Skin is warm and dry.  Neurological:     Mental Status: He is  alert.     ED Results / Procedures / Treatments   Labs (all labs ordered are listed, but only abnormal results are displayed) Labs Reviewed  URINALYSIS, ROUTINE W REFLEX MICROSCOPIC - Abnormal; Notable for the following components:      Result Value   Hgb urine dipstick TRACE (*)    Protein, ur TRACE (*)    Leukocytes,Ua SMALL (*)    All other components within normal limits  GC/CHLAMYDIA PROBE AMP ([place]) NOT AT [facility]    EKG None  Radiology No results found.  Procedures Procedures    Medications Ordered in ED Medications  cefTRIAXone (ROCEPHIN) injection 500 mg (500 mg Intramuscular Given 02/27/23 2224)  lidocaine (PF) (XYLOCAINE) 1 % injection 1-2.1 mL (2 mLs Other Given 02/27/23 2224)  metroNIDAZOLE (FLAGYL) tablet 2,000 mg (2,000 mg Oral Given 02/27/23  2212)    ED Course/ Medical Decision Making/ A&P    Patient seen and examined. History obtained directly from patient. Work-up including labs, imaging, EKG ordered in triage, if performed, were reviewed.    Labs/EKG: Independently reviewed and interpreted.  This included: Urine with small amount of blood on microscopic, 0-5 white cells.  Imaging: None ordered  Medications/Fluids: IM Rocephin, p.o. metronidazole, p.o. doxycycline prescription for home.  Most recent vital signs reviewed and are as follows: BP 131/77 (BP Location: Right Arm)   Pulse 85   Temp 98.9 F (37.2 C) (Oral)   Resp 18   Ht 5\' 5"  (1.651 m)   Wt 61.5 kg   SpO2 100%   BMI 22.56 kg/m   Initial impression: Possible urethritis, possible exposure to chlamydia and trichomonas  Home treatment plan: Will test and treat for STD exposure. Patient offered HIV and syphilis testing --declined. Patient counseled on safe sexual practices, encouraged them to avoid sexual contact for 7 days and to inform sexual partners so that they can get tested and treated as well. Patient verbalizes understanding and agrees with plan.    Return instructions discussed with patient: New or worsening symptoms  Follow-up instructions discussed with patient: STD clinic as needed                            Medical Decision Making Amount and/or Complexity of Data Reviewed Labs: ordered.  Risk Prescription drug management.   Patient with some dysuria for several days, recent high risk sexual contact.  He will be tested and treated for gonorrhea, chlamydia, trichomonas.  Declines other testing.  No concerning skin lesions for syphilis or HSV.  Unclear what the small fine lesions are on the penile shaft.        Final Clinical Impression(s) / ED Diagnoses Final diagnoses:  Dysuria    Rx / DC Orders ED Discharge Orders          Ordered    doxycycline (VIBRA-TABS) 100 MG tablet  2 times daily        02/27/23 2202               Renne Crigler, PA-C 02/27/23 2331    Glyn Ade, MD 02/28/23 [phone]

## 2023-02-27 NOTE — Discharge Instructions (Addendum)
Please read and follow all provided instructions.  Your diagnoses today include:  1. Dysuria     Tests performed today include: Test for gonorrhea and chlamydia.  Urine test: Showed a small amount of blood in the urine, no definite signs of infection Vital signs. See below for your results today.   Medications:  For treatment of gonorrhea: You were treated with a rocephin (shot) today.   For treatment of chlamydia: You are given a course of doxycycline.  You were also given metronidazole (pills) that treat for trichomonas.  This was a one-time dose of medication given in the emergency room.  Home care instructions:  Read educational materials contained in this packet and follow any instructions provided.   You should tell your partners about your infection and avoid having sex for one week to allow time for the medicine to work.  Sexually transmitted disease testing also available at:  [facility] [place], [facility] [address], [place], phone [phone] or [phone]   Monday - Friday, call for an appointment  Return instructions:  Please return to the Emergency Department if you experience worsening symptoms.  Please return if you have any other emergent concerns.  Additional Information:  Your vital signs today were: BP 131/77 (BP Location: Right Arm)   Pulse 85   Temp 98.9 F (37.2 C) (Oral)   Resp 18   Ht 5\' 5"  (1.651 m)   Wt 61.5 kg   SpO2 100%   BMI 22.56 kg/m  If your blood pressure (BP) was elevated above 135/85 this visit, please have this repeated by your doctor within one month. --------------

## 2023-02-27 NOTE — ED Notes (Signed)
Reviewed AVS with patient, patient expressed understanding of directions, denies further questions at this time. 

## 2023-02-27 NOTE — ED Triage Notes (Signed)
Patient here POV from Home.  Endorses Lower ABD Pain for 2-3 Days. No N/V/D. No Known fevers. Some Dysuria. No Hematuria. Believes he has an STD.   NAD Noted during Triage. A&Ox4. GCS 15. Ambulatory.

## 2023-03-01 ENCOUNTER — Telehealth (HOSPITAL_COMMUNITY): Payer: Self-pay

## 2023-03-01 LAB — GC/CHLAMYDIA PROBE AMP (~~LOC~~) NOT AT ARMC
Chlamydia: NEGATIVE
Comment: NEGATIVE
Comment: NORMAL
Neisseria Gonorrhea: NEGATIVE

## 2023-03-13 LAB — MISC LABCORP TEST (SEND OUT): Labcorp test code: 83935

## 2023-09-03 ENCOUNTER — Encounter (HOSPITAL_COMMUNITY): Payer: Self-pay | Admitting: Emergency Medicine

## 2023-09-03 ENCOUNTER — Ambulatory Visit (HOSPITAL_COMMUNITY)
Admission: EM | Admit: 2023-09-03 | Discharge: 2023-09-03 | Disposition: A | Discharge status: Home / Self Care | Payer: Self-pay | Attending: Emergency Medicine | Admitting: Emergency Medicine

## 2023-09-03 ENCOUNTER — Other Ambulatory Visit: Payer: Self-pay

## 2023-09-03 DIAGNOSIS — J029 Acute pharyngitis, unspecified: Secondary | ICD-10-CM | POA: Insufficient documentation

## 2023-09-03 DIAGNOSIS — Z113 Encounter for screening for infections with a predominantly sexual mode of transmission: Secondary | ICD-10-CM | POA: Insufficient documentation

## 2023-09-03 LAB — POCT RAPID STREP A (OFFICE): Rapid Strep A Screen: NEGATIVE

## 2023-09-03 MED ORDER — IBUPROFEN 800 MG PO TABS
ORAL_TABLET | ORAL | Status: AC
  Filled 2023-09-03: qty 1

## 2023-09-03 MED ORDER — IBUPROFEN 800 MG PO TABS
800.0000 mg | ORAL_TABLET | Freq: Once | ORAL | Status: AC
  Administered 2023-09-03: 800 mg via ORAL

## 2023-09-03 MED ORDER — LIDOCAINE VISCOUS HCL 2 % MT SOLN
15.0000 mL | OROMUCOSAL | 0 refills | Status: AC | PRN
Start: 2023-09-03 — End: ?

## 2023-09-03 MED ORDER — IBUPROFEN 800 MG PO TABS: 800.0000 mg | ORAL_TABLET | Freq: Three times a day (TID) | ORAL | 0 refills | Status: AC

## 2023-09-03 NOTE — ED Provider Notes (Signed)
[facility]    CSN: [id number] Arrival date & time: 09/03/23  1339      History   Chief Complaint Chief Complaint  Patient presents with   Sore Throat    HPI Shawn Ferrell is a 22 y.o. male.  3-day history of sore throat.  Today is the worst.  Rating 10/10 pain with swallowing.  He has used a little NyQuil, no other meds No fever or cough. No known sick contacts.   Also wants STD testing today. No known exposure. No penile discharge, dysuria, rash   History reviewed. No pertinent past medical history.  Patient Active Problem List   Diagnosis Date Noted   Atypical chest pain 02/23/2023   Generalized headaches 12/26/2016    History reviewed. No pertinent surgical history.     Home Medications    Prior to Admission medications   Medication Sig Start Date End Date Taking? Authorizing Provider  ibuprofen (ADVIL) 800 MG tablet Take 1 tablet (800 mg total) by mouth 3 (three) times daily. 09/03/23  Yes Jakayden Cancio, PA-C  lidocaine (XYLOCAINE) 2 % solution Use as directed 15 mLs in the mouth or throat every 3 (three) hours as needed for mouth pain. Swish/gargle and spit out 09/03/23  Yes Hosam Mcfetridge, Ray Church  ranitidine (ZANTAC) 150 MG tablet  07/25/16 12/11/19  [provider]  albuterol (VENTOLIN HFA) 108 (90 Base) MCG/ACT inhaler Inhale 1 puff into the lungs every 6 (six) hours as needed for wheezing or shortness of breath. 12/18/21   Tomi Bamberger, PA-C  ARIPiprazole (ABILIFY) 15 MG tablet Take by mouth.    [provider]  cetirizine (ZYRTEC ALLERGY) 10 MG tablet Take 1 tablet (10 mg total) by mouth daily. 01/15/20   Avegno, Zachery Dakins, FNP  econazole nitrate 1 % cream Apply topically daily. 06/16/22   Ellsworth Lennox, PA-C  fluticasone [facility]) 50 MCG/ACT nasal spray instill 1 spray into each nostril once daily 07/27/16   [provider]  hydrocortisone 2.5 % cream apply to affected area three times a day 07/27/16   [provider]  traZODone (DESYREL) 50 MG tablet Take 50 mg by mouth.    [provider]    Family History Family History  Problem Relation Age of Onset   Hypertension Mother    Stroke Father    Hypertension Father     Social History Social History   Tobacco Use   Smoking status: Every Day    Types: Cigars   Smokeless tobacco: Never  Vaping Use   Vaping status: Former  Substance Use Topics   Alcohol use: Yes    Comment: Occ   Drug use: Not Currently    Types: Marijuana     Allergies   Patient has no known allergies.   Review of Systems Review of Systems Per HPI  Physical Exam Triage Vital Signs ED Triage Vitals  Encounter Vitals Group     BP 09/03/23 1405 134/86     Systolic BP Percentile --      Diastolic BP Percentile --      Pulse Rate 09/03/23 1405 94     Resp 09/03/23 1405 16     Temp 09/03/23 1405 98.3 F (36.8 C)     Temp Source 09/03/23 1405 Oral     SpO2 09/03/23 1405 98 %     Weight --      Height --      Head Circumference --      Peak Flow --  Pain Score 09/03/23 1406 7     Pain Loc --      Pain Education --      Exclude from Growth Chart --    No data found.  Updated Vital Signs BP 134/86 (BP Location: Right Arm)   Pulse 94   Temp 98.3 F (36.8 C) (Oral)   Resp 16   SpO2 98%    Physical Exam Vitals and nursing note reviewed.  Constitutional:      General: He is not in acute distress.    Appearance: He is not ill-appearing.  HENT:     Right Ear: Tympanic membrane and ear canal normal.     Left Ear: Tympanic membrane and ear canal normal.     Nose: No rhinorrhea.     Mouth/Throat:     Mouth: Mucous membranes are moist. No oral lesions.     Pharynx: Oropharynx is clear. Posterior oropharyngeal erythema present. No oropharyngeal exudate.     Tonsils: No tonsillar exudate or tonsillar abscesses. 1+ on the left.     Comments: Normal phonation, tolerating secretions. Erythema without exudate Eyes:      Conjunctiva/sclera: Conjunctivae normal.  Cardiovascular:     Rate and Rhythm: Normal rate and regular rhythm.     Pulses: Normal pulses.     Heart sounds: Normal heart sounds.  Pulmonary:     Effort: Pulmonary effort is normal.     Breath sounds: Normal breath sounds.  Musculoskeletal:     Cervical back: Normal range of motion.  Lymphadenopathy:     Cervical: No cervical adenopathy.  Skin:    General: Skin is warm and dry.  Neurological:     Mental Status: He is alert and oriented to person, place, and time.      UC Treatments / Results  Labs (all labs ordered are listed, but only abnormal results are displayed) Labs Reviewed  CULTURE, GROUP A STREP [facility])  POCT RAPID STREP A (OFFICE)  CYTOLOGY, (ORAL, ANAL, URETHRAL) ANCILLARY ONLY    EKG  Radiology No results found.  Procedures Procedures   Medications Ordered in UC Medications  ibuprofen (ADVIL) tablet 800 mg (has no administration in time range)    Initial Impression / Assessment and Plan / UC Course  I have reviewed the triage vital signs and the nursing notes.  Pertinent labs & imaging results that were available during my care of the patient were reviewed by me and considered in my medical decision making (see chart for details).  Rapid strep negative, will culture Afebrile and overall well-appearing Ibuprofen dose given in clinic.  Discussed symptomatic care at home, OTC medications to use and home remedies.  Also sent lidocaine for gargle and spit.  Cytology swab pending per patient request.  Return precautions discussed. Patient agrees to plan. All questions answered   Final Clinical Impressions(s) / UC Diagnoses   Final diagnoses:  Sore throat  Screen for STD (sexually transmitted disease)     Discharge Instructions      The strep test today was negative, but I am going to confirm this with a culture. It might take 2-3 days. We will call you if the test has any bacteria.   Ibuprofen can  be used every 6 hours. You can also take tylenol  Make sure you are drinking lots of fluids! Use the lidocaine gargle and spit as often as needed to numb the throat   We will call you if anything on your swab returns positive. This can take  about 24 hours. You can also see these results on MyChart. Please abstain from sexual intercourse until your results return.    ED Prescriptions     Medication Sig Dispense Auth. Provider   ibuprofen (ADVIL) 800 MG tablet Take 1 tablet (800 mg total) by mouth 3 (three) times daily. 21 tablet Cristino Degroff, PA-C   lidocaine (XYLOCAINE) 2 % solution Use as directed 15 mLs in the mouth or throat every 3 (three) hours as needed for mouth pain. Swish/gargle and spit out 100 mL Skyllar Notarianni, Lurena Joiner, PA-C      PDMP not reviewed this encounter.   Marlow Baars, [place] 09/03/23 4098

## 2023-09-03 NOTE — Discharge Instructions (Addendum)
The strep test today was negative, but I am going to confirm this with a culture. It might take 2-3 days. We will call you if the test has any bacteria.   Ibuprofen can be used every 6 hours. You can also take tylenol  Make sure you are drinking lots of fluids! Use the lidocaine gargle and spit as often as needed to numb the throat   We will call you if anything on your swab returns positive. This can take about 24 hours. You can also see these results on MyChart. Please abstain from sexual intercourse until your results return.

## 2023-09-03 NOTE — ED Triage Notes (Signed)
Sore throat for the past 3 days, not able to swallow.

## 2023-09-04 ENCOUNTER — Telehealth (HOSPITAL_COMMUNITY): Payer: Self-pay

## 2023-09-04 LAB — CULTURE, GROUP A STREP (THRC)

## 2023-09-04 LAB — CYTOLOGY, (ORAL, ANAL, URETHRAL) ANCILLARY ONLY
Chlamydia: POSITIVE — AB
Comment: NEGATIVE
Comment: NEGATIVE
Comment: NORMAL
Neisseria Gonorrhea: NEGATIVE
Trichomonas: POSITIVE — AB

## 2023-09-04 MED ORDER — AMOXICILLIN 500 MG PO CAPS: 500.0000 mg | ORAL_CAPSULE | Freq: Two times a day (BID) | ORAL | 0 refills | Status: AC

## 2023-09-04 NOTE — Telephone Encounter (Signed)
Per protocol, pt requires tx with Amoxicillin.  Attempted to reach patient x1. Unable to LVM. Rx sent to pharmacy on file.

## 2023-09-06 ENCOUNTER — Telehealth (HOSPITAL_COMMUNITY): Payer: Self-pay

## 2023-09-06 MED ORDER — METRONIDAZOLE 500 MG PO TABS: 2000.0000 mg | ORAL_TABLET | Freq: Once | ORAL | 0 refills | Status: AC

## 2023-09-06 MED ORDER — DOXYCYCLINE HYCLATE 100 MG PO CAPS: 100.0000 mg | ORAL_CAPSULE | Freq: Two times a day (BID) | ORAL | 0 refills | Status: AC

## 2023-09-06 NOTE — Telephone Encounter (Signed)
 Per protocol, pt requires tx with metronidazole and Doxycycline.  Reviewed with patient, verified pharmacy, prescription sent.

## 2023-12-01 ENCOUNTER — Ambulatory Visit
Admission: EM | Admit: 2023-12-01 | Discharge: 2023-12-01 | Disposition: A | Discharge status: Home / Self Care | Payer: Self-pay | Attending: Internal Medicine | Admitting: Internal Medicine

## 2023-12-01 DIAGNOSIS — Z113 Encounter for screening for infections with a predominantly sexual mode of transmission: Secondary | ICD-10-CM | POA: Insufficient documentation

## 2023-12-01 DIAGNOSIS — R3911 Hesitancy of micturition: Secondary | ICD-10-CM | POA: Insufficient documentation

## 2023-12-01 DIAGNOSIS — Z9189 Other specified personal risk factors, not elsewhere classified: Secondary | ICD-10-CM | POA: Insufficient documentation

## 2023-12-01 DIAGNOSIS — N410 Acute prostatitis: Secondary | ICD-10-CM | POA: Insufficient documentation

## 2023-12-01 LAB — POCT URINALYSIS DIP (MANUAL ENTRY)
Bilirubin, UA: NEGATIVE
Glucose, UA: NEGATIVE mg/dL
Ketones, POC UA: NEGATIVE mg/dL
Nitrite, UA: NEGATIVE
Spec Grav, UA: >=1.03 — AB (ref 1.010–1.025)
Urobilinogen, UA: 0.2 U/dL
pH, UA: 6 (ref 5.0–8.0)

## 2023-12-01 MED ORDER — CIPROFLOXACIN HCL 500 MG PO TABS: 500.0000 mg | ORAL_TABLET | Freq: Two times a day (BID) | ORAL | 0 refills | Status: AC

## 2023-12-01 MED ORDER — CEFTRIAXONE SODIUM 500 MG IJ SOLR
500.0000 mg | INTRAMUSCULAR | Status: DC
  Administered 2023-12-01: 500 mg via INTRAMUSCULAR

## 2023-12-01 NOTE — Discharge Instructions (Addendum)
 You have a bacterial infection of the prostate.  We have given you an antibiotic shot today, I'd also like to treat with oral antibiotics.  Take ciprofloxacin every 12 hours for the next 5 days.   Drink plenty of water to stay well hydrated.  Tylenol as needed for abdominal pain.  STD testing pending, this will take 2-3 days to result. We will only call you if your testing is positive for any infection(s) and we will provide treatment.  Avoid sexual intercourse until your STD results come back.  If any of your STD results are positive, you will need to avoid sexual intercourse for 7 days while you are being treated to prevent spread of STD.  Condom use is the best way to prevent spread of STDs. Notify partner(s) of any positive results.  Return to urgent care as needed.

## 2023-12-01 NOTE — ED Triage Notes (Signed)
"  While I am collecting urine I would like to do an STI swab, no symptoms"

## 2023-12-01 NOTE — ED Triage Notes (Signed)
"  I have been having abd cramps that started becoming more recurrent for about a month ago". "I do notice that I pee a lot at night with urgency". No nausea or vomiting.  No dysuria. Stools "vary in consistency".
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
## 2023-12-02 LAB — URINE CULTURE: Culture: NO GROWTH

## 2023-12-02 LAB — HIV ANTIBODY (ROUTINE TESTING W REFLEX): HIV Screen 4th Generation wRfx: NONREACTIVE

## 2023-12-02 LAB — RPR: RPR Ser Ql: NONREACTIVE

## 2023-12-04 LAB — CYTOLOGY, (ORAL, ANAL, URETHRAL) ANCILLARY ONLY
Chlamydia: NEGATIVE
Comment: NEGATIVE
Comment: NEGATIVE
Comment: NORMAL
Neisseria Gonorrhea: NEGATIVE
Trichomonas: POSITIVE — AB

## 2023-12-05 ENCOUNTER — Telehealth (HOSPITAL_COMMUNITY): Payer: Self-pay

## 2023-12-05 MED ORDER — METRONIDAZOLE 500 MG PO TABS: 2000.0000 mg | ORAL_TABLET | Freq: Once | ORAL | 0 refills | Status: AC

## 2023-12-05 NOTE — Telephone Encounter (Signed)
 Per protocol, pt requires tx with metronidazole. Reviewed with patient, verified pharmacy, prescription sent.

## 2024-01-27 ENCOUNTER — Encounter (HOSPITAL_BASED_OUTPATIENT_CLINIC_OR_DEPARTMENT_OTHER): Payer: Self-pay | Admitting: Emergency Medicine

## 2024-01-27 ENCOUNTER — Emergency Department (HOSPITAL_BASED_OUTPATIENT_CLINIC_OR_DEPARTMENT_OTHER): Payer: Self-pay | Admitting: Radiology

## 2024-01-27 ENCOUNTER — Emergency Department (HOSPITAL_BASED_OUTPATIENT_CLINIC_OR_DEPARTMENT_OTHER)
Admission: EM | Admit: 2024-01-27 | Discharge: 2024-01-27 | Disposition: A | Discharge status: Home / Self Care | Payer: Self-pay | Attending: Emergency Medicine | Admitting: Emergency Medicine

## 2024-01-27 ENCOUNTER — Other Ambulatory Visit: Payer: Self-pay

## 2024-01-27 DIAGNOSIS — F17290 Nicotine dependence, other tobacco product, uncomplicated: Secondary | ICD-10-CM | POA: Insufficient documentation

## 2024-01-27 DIAGNOSIS — M25512 Pain in left shoulder: Secondary | ICD-10-CM | POA: Insufficient documentation

## 2024-01-27 DIAGNOSIS — X501XXA Overexertion from prolonged static or awkward postures, initial encounter: Secondary | ICD-10-CM | POA: Insufficient documentation

## 2024-01-27 MED ORDER — IBUPROFEN 400 MG PO TABS
600.0000 mg | ORAL_TABLET | Freq: Once | ORAL | Status: AC
  Administered 2024-01-27: 600 mg via ORAL
  Filled 2024-01-27: qty 1

## 2024-01-27 MED ORDER — ACETAMINOPHEN 500 MG PO TABS
1000.0000 mg | ORAL_TABLET | Freq: Once | ORAL | Status: AC
  Administered 2024-01-27: 1000 mg via ORAL
  Filled 2024-01-27: qty 2

## 2024-01-27 MED ORDER — HYDROMORPHONE HCL 1 MG/ML IJ SOLN
1.0000 mg | Freq: Once | INTRAMUSCULAR | Status: DC
  Filled 2024-01-27: qty 1

## 2024-01-27 MED ORDER — OXYCODONE HCL 5 MG PO TABS
5.0000 mg | ORAL_TABLET | Freq: Once | ORAL | Status: AC
  Administered 2024-01-27: 5 mg via ORAL
  Filled 2024-01-27: qty 1

## 2024-01-27 NOTE — ED Notes (Signed)
 Discharge paperwork given and verbally understood.... Pt requested a sling... Provider informed and they denied the request..Aaron Aas

## 2024-01-27 NOTE — ED Provider Notes (Signed)
 [facility] Emergency Department Provider Note MRN:  [id number]  Arrival date & time: 01/27/24     Chief Complaint   Shoulder Injury   History of Present Illness   Shawn Ferrell is a 23 y.o. year-old male with no pertinent past medical history presenting to the ED with chief complaint of shoulder injury.  Play fighting at home, feels like the shoulder popped out, thinks it is dislocated.  Fell on the shoulder.  Denies any other injuries.  Review of Systems  A thorough review of systems was obtained and all systems are negative except as noted in the HPI and PMH.   Patient's Health History   History reviewed. No pertinent past medical history.  History reviewed. No pertinent surgical history.  Family History  Problem Relation Age of Onset   Hypertension Mother    Stroke Father    Hypertension Father     Social History   Socioeconomic History   Marital status: Single    Spouse name: Not on file   Number of children: Not on file   Years of education: Not on file   Highest education level: Not on file  Occupational History   Not on file  Tobacco Use   Smoking status: Every Day    Types: Cigars   Smokeless tobacco: Never  Vaping Use   Vaping status: Former  Substance and Sexual Activity   Alcohol use: Not Currently    Comment: Occassionally.   Drug use: Not Currently    Types: Marijuana   Sexual activity: Yes    Birth control/protection: None  Other Topics Concern   Not on file  Social History Narrative   Lives with mom only. Not employed currently. Grades above average   Social Drivers of Health   Financial Resource Strain: Not on File (01/06/2022)   Received from [employer], [employer] Strain    Financial Resource Strain: 0  Food Insecurity: Not on File (06/15/2023)   Received from [facility] Insecurity    Food: 0  Transportation Needs: Not on File (01/06/2022)   Received from [employer], [employer] Needs     Transportation: 0  Physical Activity: Not on File (01/06/2022)   Received from [place], [place]   Physical Activity    Physical Activity: 0  Stress: Not on File (01/06/2022)   Received from [facility], [place]   Stress    Stress: 0  Social Connections: Not on File (06/03/2023)   Received from [employer]   Social Connections    Connectedness: 0  Intimate Partner Violence: Not on file     Physical Exam   Vitals:   01/27/24 0545 01/27/24 0615  BP: (!) 139/97 (!) 133/94  Pulse: (!) 104 98  Resp:    Temp:    SpO2: 98% 96%    CONSTITUTIONAL: Well-appearing, NAD NEURO/PSYCH:  Alert and oriented x 3, no focal deficits EYES:  eyes equal and reactive ENT/NECK:  no LAD, no JVD CARDIO: Regular rate, well-perfused, normal S1 and S2 PULM:  CTAB no wheezing or rhonchi GI/GU:  non-distended, non-tender MSK/SPINE:  No gross deformities, no edema, left shoulder tender to palpation, significantly reduced range of motion due to pain SKIN:  no rash, atraumatic   *Additional and/or pertinent findings included in MDM below  Diagnostic and Interventional Summary    EKG Interpretation Date/Time:    Ventricular Rate:    PR Interval:    QRS Duration:    QT Interval:  QTC Calculation:   R Axis:      Text Interpretation:         Labs Reviewed - No data to display  DG Shoulder Left Portable    (Results Pending)    Medications  oxyCODONE (Oxy IR/ROXICODONE) immediate release tablet 5 mg (5 mg Oral Given 01/27/24 0606)     Procedures  /  Critical Care Procedures  ED Course and Medical Decision Making  Initial Impression and Ddx Fracture versus dislocation awaiting x-ray.  Neurovascularly intact left upper extremity.  Past medical/surgical history that increases complexity of ED encounter: None  Interpretation of Diagnostics I personally reviewed the shoulder x-ray and my interpretation is as follows: No dislocation or obvious fracture    Patient Reassessment and Ultimate  Disposition/Management     Awaiting radiology interpretation of x-ray, signed out to oncoming provider at shift change.  Patient management required discussion with the following services or consulting groups:  None  Complexity of Problems Addressed Acute complicated illness or Injury  Additional Data Reviewed and Analyzed Further history obtained from: Further history from spouse/family member  Additional Factors Impacting ED Encounter Risk None  Merrick Abe. Harless Lien, MD [facility] Emergency Medicine [facility] [email or web address]  Final Clinical Impressions(s) / ED Diagnoses     ICD-10-CM   1. Acute pain of left shoulder  M25.512       ED Discharge Orders     None        Discharge Instructions Discussed with and Provided to Patient:   Discharge Instructions   None      Edson Graces, MD 01/27/24 0700

## 2024-01-27 NOTE — ED Provider Notes (Signed)
 Patient signed out to me in 0700 by Dr. Harless Lien pending x-ray read.  In short this is a 23 year old male with history of depression and to the emergency department with shoulder pain after he fell on his shoulder.  No acute distress.  Did receive oxycodone for pain.  An x-ray of his shoulder performed that is pending read at this time.  7:49 AM No acute abnormality on shoulder XR, likely sprain or contusion. He is stable for discharge home with outpatient follow up.   Kingsley, Alta Goding K, DO 01/27/24 [phone]

## 2024-01-27 NOTE — Discharge Instructions (Signed)
 You were seen in the emergency department for your shoulder pain.  Your x-ray showed no broken bones or dislocation and you likely sprained your shoulder which is an injury to one of the ligaments.  You can ice your shoulder and keep it elevated to help with pain and swelling and can take Tylenol  and Motrin  every 6 hours as needed for pain.  You can follow-up with your primary doctor in the next few days to have your symptoms rechecked and should avoid lifting more than 10 pounds with that arm until you are cleared by your primary doctor.  You should try to range and stretch your shoulder to prevent your ligaments from getting stiff.  You can follow-up with orthopedics as needed for continued pain.  You should return to the emergency department if your fingers turn numb or blue or if you have any other new or concerning symptoms.

## 2024-01-27 NOTE — ED Triage Notes (Signed)
 Patient coming to ED for evaluation of L shoulder injury.  Reports he was play fighting around 10 PM last night and heard "my shoulder pop out."  C/o mild numbness to L fingers.  Has attempted to reduce shoulder himself without success.  No previous hx of dislocation

## 2024-09-07 ENCOUNTER — Other Ambulatory Visit: Payer: Self-pay

## 2024-09-07 ENCOUNTER — Encounter (HOSPITAL_BASED_OUTPATIENT_CLINIC_OR_DEPARTMENT_OTHER): Payer: Self-pay

## 2024-09-07 DIAGNOSIS — Z79899 Other long term (current) drug therapy: Secondary | ICD-10-CM | POA: Diagnosis not present

## 2024-09-07 DIAGNOSIS — Y92410 Unspecified street and highway as the place of occurrence of the external cause: Secondary | ICD-10-CM | POA: Insufficient documentation

## 2024-09-07 DIAGNOSIS — M545 Low back pain, unspecified: Secondary | ICD-10-CM | POA: Insufficient documentation

## 2024-09-07 DIAGNOSIS — M542 Cervicalgia: Secondary | ICD-10-CM | POA: Diagnosis present
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
## 2024-09-08 ENCOUNTER — Emergency Department (HOSPITAL_BASED_OUTPATIENT_CLINIC_OR_DEPARTMENT_OTHER)

## 2024-09-08 ENCOUNTER — Emergency Department (HOSPITAL_BASED_OUTPATIENT_CLINIC_OR_DEPARTMENT_OTHER)
Admission: EM | Admit: 2024-09-08 | Discharge: 2024-09-08 | Disposition: A | Attending: Emergency Medicine | Admitting: Emergency Medicine

## 2024-09-08 DIAGNOSIS — S39012A Strain of muscle, fascia and tendon of lower back, initial encounter: Secondary | ICD-10-CM

## 2024-09-08 DIAGNOSIS — S161XXA Strain of muscle, fascia and tendon at neck level, initial encounter: Secondary | ICD-10-CM

## 2024-09-08 MED ORDER — CYCLOBENZAPRINE HCL 10 MG PO TABS: 10.0000 mg | ORAL_TABLET | Freq: Three times a day (TID) | ORAL | 0 refills | Status: AC | PRN

## 2024-09-08 MED ORDER — OXYCODONE-ACETAMINOPHEN 5-325 MG PO TABS
1.0000 | ORAL_TABLET | ORAL | Status: DC | PRN
  Administered 2024-09-08: 1 via ORAL
  Filled 2024-09-08: qty 1

## 2024-09-08 MED ORDER — HYDROCODONE-ACETAMINOPHEN 5-325 MG PO TABS
2.0000 | ORAL_TABLET | Freq: Once | ORAL | Status: AC
  Administered 2024-09-08: 2 via ORAL
  Filled 2024-09-08: qty 2

## 2024-09-08 NOTE — Discharge Instructions (Addendum)
Take ibuprofen 600 mg every 6 hours as needed for pain.  Begin taking Flexeril as prescribed as needed for pain not relieved with ibuprofen.  Follow-up with primary doctor if not improving in the next week.
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
